# Patient Record
Sex: Female | Born: 1985
Health system: Southern US, Community
[De-identification: ages and names within clinical notes are randomized; demographics above are authoritative.]

## PROBLEM LIST (undated history)

## (undated) DIAGNOSIS — U071 COVID-19: Secondary | ICD-10-CM

## (undated) DIAGNOSIS — J45909 Unspecified asthma, uncomplicated: Secondary | ICD-10-CM

## (undated) DIAGNOSIS — D259 Leiomyoma of uterus, unspecified: Secondary | ICD-10-CM

## (undated) HISTORY — DX: Unspecified asthma, uncomplicated: J45.909

## (undated) HISTORY — PX: NO PAST SURGERIES: SHX2092

---

## 2019-07-10 ENCOUNTER — Emergency Department (HOSPITAL_COMMUNITY)
Admission: EM | Admit: 2019-07-10 | Discharge: 2019-07-10 | Disposition: A | Discharge status: Home / Self Care | Payer: 59 | Attending: Emergency Medicine | Admitting: Emergency Medicine

## 2019-07-10 ENCOUNTER — Encounter (HOSPITAL_COMMUNITY): Payer: Self-pay | Admitting: Emergency Medicine

## 2019-07-10 ENCOUNTER — Other Ambulatory Visit: Payer: Self-pay

## 2019-07-10 ENCOUNTER — Emergency Department (HOSPITAL_COMMUNITY): Payer: 59

## 2019-07-10 DIAGNOSIS — R102 Pelvic and perineal pain: Secondary | ICD-10-CM | POA: Diagnosis not present

## 2019-07-10 DIAGNOSIS — R1031 Right lower quadrant pain: Secondary | ICD-10-CM | POA: Insufficient documentation

## 2019-07-10 DIAGNOSIS — Z79899 Other long term (current) drug therapy: Secondary | ICD-10-CM | POA: Diagnosis not present

## 2019-07-10 LAB — URINALYSIS, ROUTINE W REFLEX MICROSCOPIC
Bilirubin Urine: NEGATIVE
Glucose, UA: NEGATIVE mg/dL
Hgb urine dipstick: NEGATIVE
Ketones, ur: 5 mg/dL — AB
Leukocytes,Ua: NEGATIVE
Nitrite: NEGATIVE
Protein, ur: NEGATIVE mg/dL
Specific Gravity, Urine: 1.006 (ref 1.005–1.030)
pH: 5 (ref 5.0–8.0)

## 2019-07-10 LAB — COMPREHENSIVE METABOLIC PANEL
ALT: 23 U/L (ref 0–44)
AST: 23 U/L (ref 15–41)
Albumin: 4.4 g/dL (ref 3.5–5.0)
Alkaline Phosphatase: 60 U/L (ref 38–126)
Anion gap: 13 (ref 5–15)
BUN: 10 mg/dL (ref 6–20)
CO2: 23 mmol/L (ref 22–32)
Calcium: 9.7 mg/dL (ref 8.9–10.3)
Chloride: 101 mmol/L (ref 98–111)
Creatinine, Ser: 0.69 mg/dL (ref 0.44–1.00)
GFR calc Af Amer: >60 mL/min (ref >60)
GFR calc non Af Amer: >60 mL/min (ref >60)
Glucose, Bld: 99 mg/dL (ref 70–99)
Potassium: 3.9 mmol/L (ref 3.5–5.1)
Sodium: 137 mmol/L (ref 135–145)
Total Bilirubin: 1.1 mg/dL (ref 0.3–1.2)
Total Protein: 8.4 g/dL — ABNORMAL HIGH (ref 6.5–8.1)

## 2019-07-10 LAB — CBC
HCT: 46.5 % — ABNORMAL HIGH (ref 36.0–46.0)
Hemoglobin: 15.4 g/dL — ABNORMAL HIGH (ref 12.0–15.0)
MCH: 31.2 pg (ref 26.0–34.0)
MCHC: 33.1 g/dL (ref 30.0–36.0)
MCV: 94.3 fL (ref 80.0–100.0)
Platelets: 285 10*3/uL (ref 150–400)
RBC: 4.93 MIL/uL (ref 3.87–5.11)
RDW: 13 % (ref 11.5–15.5)
WBC: 8.7 10*3/uL (ref 4.0–10.5)
nRBC: 0 % (ref 0.0–0.2)

## 2019-07-10 LAB — LIPASE, BLOOD: Lipase: 29 U/L (ref 11–51)

## 2019-07-10 LAB — I-STAT BETA HCG BLOOD, ED (MC, WL, AP ONLY): I-stat hCG, quantitative: <5 m[IU]/mL (ref <5)

## 2019-07-10 MED ORDER — IOHEXOL 300 MG/ML  SOLN
100.0000 mL | Freq: Once | INTRAMUSCULAR | Status: AC | PRN
  Administered 2019-07-10: 100 mL via INTRAVENOUS

## 2019-07-10 NOTE — ED Provider Notes (Signed)
[facility] EMERGENCY DEPARTMENT Provider Note   CSN: [id number] Arrival date & time: 07/10/19  1010     History   Chief Complaint Chief Complaint  Patient presents with  . Abdominal Pain    HPI Cassandrea Bagnasco is a 33 y.o. female.     HPI    33 year old female presents today with complaints of abdominal pain.  She notes yesterday upon awakening she had mid lower abdominal pain.  She notes that occasionally she wakes up in the morning and has pain in her lower abdomen, she is able to have a bowel movement which eases her symptoms.  She notes having a BM yesterday did not have significant improvement in her symptoms.  She notes the pain worsened throughout the day, worse with palpation of the lower abdomen diffusely.  She denies any abrupt onset pain, denies any urinary symptoms, vaginal discharge or bleeding.  She notes that today the symptoms have improved but still has some tenderness in the lower abdomen.  She was seen in urgent care and referred to the emergency room.  She notes she had a urinalysis at the urgent care that did not show any signs of infection.  She is sexually active with a female partner.  She notes she has not had anything to eat today but has been drinking water.    History reviewed. No pertinent past medical history.  There are no active problems to display for this patient.   History reviewed. No pertinent surgical history.   OB History   No obstetric history on file.      Home Medications    Prior to Admission medications   Medication Sig Start Date End Date Taking? Authorizing Provider  Prenatal Vit-Fe Fumarate-FA (MULTIVITAMIN-PRENATAL) 27-0.8 MG TABS tablet Take 1 tablet by mouth daily at 12 noon.   Yes [provider]    Family History No family history on file.  Social History Social History   Tobacco Use  . Smoking status: Never Smoker  . Smokeless tobacco: Never Used  Substance Use Topics  . Alcohol use:  Yes  . Drug use: Not Currently     Allergies   Patient has no known allergies.   Review of Systems Review of Systems  All other systems reviewed and are negative.   Physical Exam Updated Vital Signs BP 118/78 (BP Location: Right Arm)   Pulse 67   Temp 98.4 F (36.9 C) (Oral)   Resp 14   Ht 5' 5.5" (1.664 m)   Wt 77.1 kg   LMP 06/18/2019   SpO2 100%   BMI 27.86 kg/m   Physical Exam Vitals signs and nursing note reviewed.  Constitutional:      Appearance: She is well-developed.  HENT:     Head: Normocephalic and atraumatic.  Eyes:     General: No scleral icterus.       Right eye: No discharge.        Left eye: No discharge.     Conjunctiva/sclera: Conjunctivae normal.     Pupils: Pupils are equal, round, and reactive to light.  Neck:     Musculoskeletal: Normal range of motion.     Vascular: No JVD.     Trachea: No tracheal deviation.  Pulmonary:     Effort: Pulmonary effort is normal.     Breath sounds: No stridor.  Abdominal:     Comments: Abdomen is soft with minimal tenderness in the suprapubic and right lower, no rebound guarding or masses  Neurological:     Mental Status: She is alert and oriented to person, place, and time.     Coordination: Coordination normal.  Psychiatric:        Behavior: Behavior normal.        Thought Content: Thought content normal.        Judgment: Judgment normal.     ED Treatments / Results  Labs (all labs ordered are listed, but only abnormal results are displayed) Labs Reviewed  COMPREHENSIVE METABOLIC PANEL - Abnormal; Notable for the following components:      Result Value   Total Protein 8.4 (*)    All other components within normal limits  CBC - Abnormal; Notable for the following components:   Hemoglobin 15.4 (*)    HCT 46.5 (*)    All other components within normal limits  URINALYSIS, ROUTINE W REFLEX MICROSCOPIC - Abnormal; Notable for the following components:   Color, Urine STRAW (*)    Ketones, ur 5  (*)    All other components within normal limits  LIPASE, BLOOD  I-STAT BETA HCG BLOOD, ED ([facility], [facility], [facility] ONLY)    EKG None  Radiology Ct Abdomen Pelvis W Contrast  Result Date: 07/10/2019 CLINICAL DATA:  Abdominal pain. Suspected appendicitis. EXAM: CT ABDOMEN AND PELVIS WITH CONTRAST TECHNIQUE: Multidetector CT imaging of the abdomen and pelvis was performed using the standard protocol following bolus administration of intravenous contrast. CONTRAST:  149mL OMNIPAQUE IOHEXOL 300 MG/ML  SOLN COMPARISON:  None. FINDINGS: Lower Chest: No acute findings. Hepatobiliary: No hepatic masses identified. Gallbladder is unremarkable. No evidence of biliary ductal dilatation. Pancreas:  No mass or inflammatory changes. Spleen: Within normal limits in size and appearance. Adrenals/Urinary Tract: No masses identified. No evidence of hydronephrosis. Stomach/Bowel: No evidence of obstruction, inflammatory process or abnormal fluid collections. Normal appendix visualized. Vascular/Lymphatic: No pathologically enlarged lymph nodes. No abdominal aortic aneurysm. Reproductive: Uterine fibroids are seen with largest projecting exophytically from the uterine fundus into the lower abdomen, measuring 15.6 x 9.1 cm. This largest fibroid shows central calcification. A small right ovarian corpus luteum cyst is also seen measuring approximately 2 cm, and a small amount of free fluid is seen in the right adnexa. Other:  None. Musculoskeletal:  No suspicious bone lesions identified. IMPRESSION: No evidence of appendicitis. Small right ovarian corpus luteum cyst and small amount of free fluid, most likely physiologic in a reproductive age female. Uterine fibroids, largest measuring 15.6 cm. Electronically Signed   By: Marlaine Hind M.D.   On: 07/10/2019 18:02    Procedures Procedures (including critical care time)  Medications Ordered in ED Medications  iohexol (OMNIPAQUE) 300 MG/ML solution 100 mL (100 mLs Intravenous Contrast  Given 07/10/19 1737)     Initial Impression / Assessment and Plan / ED Course  I have reviewed the triage vital signs and the nursing notes.  Pertinent labs & imaging results that were available during my care of the patient were reviewed by me and considered in my medical decision making (see chart for details).        L  Assessment/Plan: 33 year old female presents today with lower abdominal pain.  She is very well-appearing in no acute distress.  She does have improvement in her symptoms upon arrival to the emergency room.  Her labs are reassuring showing no signs of infectious etiology, no fever or tachycardia here.  I discussed given the fact she was having right lower abdominal pain appendicitis.  Although I have very low suspicion for this patient  is very concerned about this and would like a CT scan rule out appendicitis.  I have low suspicion for any acute pelvic etiology including infectious torsion or any other concerning etiology at this time.  Patient CT returned showing no acute life-threatening pathology.  Incidental findings were noted including ovarian cyst and uterine fibroids these were discussed with the patient patient will follow-up with OB for discussion of implications and attempting to get pregnant.  She is given strict return precautions, she verbalized understanding and agreement to today's plan.   Final Clinical Impressions(s) / ED Diagnoses   Final diagnoses:  Right lower quadrant abdominal pain    ED Discharge Orders    None       Francee Gentile 07/11/19 1240    Lacretia Leigh, MD 07/12/19 1327

## 2019-07-10 NOTE — Discharge Instructions (Addendum)
Please read attached information. If you experience any new or worsening signs or symptoms please return to the emergency room for evaluation. Please follow-up with your primary care provider or specialist as discussed.  °

## 2019-07-10 NOTE — ED Notes (Signed)
Patient verbalizes understanding of discharge instructions. Opportunity for questioning and answers were provided. Armband removed by staff, pt discharged from ED ambulatory.   

## 2019-07-10 NOTE — ED Triage Notes (Signed)
Patient sent from UC c/o abdominal pain onset of yesterday. Pain is lower right quadrant and mid abdominal and sharp. Normal BMs. Urinalysis completed at [facility].

## 2019-09-07 NOTE — L&D Delivery Note (Signed)
Delivery Note At 6:03 AM a viable and healthy female was delivered via Vaginal, Spontaneous (Presentation: right occiput, anterior      ).  APGAR: 8, 9; weight pending .   Placenta status:  spontaneous, intact .  Cord: 3VC    Anesthesia: Epidural Episiotomy:  None Lacerations:  None Suture Repair: NA Est. Blood Loss (mL):  2228cc  Mom to postpartum.  Baby to Couplet care / Skin to Skin.  For approximately 60 minutes with an epidural.  She delivered a vigorous delivery vertex ROA presentation with Apgar scores of 8 at 1 minute and 9 at 5 minutes for an intact perineum.  The infant was passed to the maternal abdomen.  1 minute delay in cord clamping was performed.  Placenta spontaneously delivered intact.  Following delivery of the placenta brisk bleeding was noted.  Bimanual massage was initiated.  Methergine was called for.  Additionally misoprostol 1000 mcg was requested.  Despite vigorous bimanual massage, rectal misoprostol 1000 mcg, and IM Methergine bleeding continued.  Tranexamic acid 1 g requested.  Vigorous bimanual massage was continued.  Hemabate was requested.  Code hemorrhage was called at an EBL of 1200 cc.  Persistent at the lower uterine segment was noted.  Bakri Balloon placement was attempted x2.  Each time the balloon could be filled to a max volume of 150 cc but then was expelled through the cervix into the vagina.  Following removal after the second attempt the patient's bleeding was noted to be minimal.  No further attempts at balloon placement were performed.  A Foley catheter was placed in the bladder.  Code hemorrhage resolved.  Total EBL 2228 cc.  Patient starting hemoglobin was 11.5.  Estimated ending hemoglobin base EBL approximately 7-8.  Patient's vitals were stable.  Patient was normotensive with mild tachycardia 107.  Discussed with patient risks/benefits/alternatives of proceeding with blood transfusion versus waiting.  Patient was feeling dizzy.  Given EBL we will proceed  with transfusion, 1 unit packed red cells.  Will reassess patient symptoms following transfusion of 1 unit.  All sponge, instrument, needle counts were correct.  Mother spiked a temp to 101 following code hemorrhage.  We will proceed with Ancef 1 g every 8 hours for 24 hours   Vanessa Kick 05/30/2020, 6:46 AM

## 2019-09-17 ENCOUNTER — Emergency Department (HOSPITAL_COMMUNITY): Payer: 59

## 2019-09-17 ENCOUNTER — Other Ambulatory Visit: Payer: Self-pay

## 2019-09-17 ENCOUNTER — Emergency Department (HOSPITAL_COMMUNITY)
Admission: EM | Admit: 2019-09-17 | Discharge: 2019-09-17 | Disposition: A | Discharge status: Home / Self Care | Payer: 59 | Attending: Emergency Medicine | Admitting: Emergency Medicine

## 2019-09-17 ENCOUNTER — Encounter (HOSPITAL_COMMUNITY): Payer: Self-pay | Admitting: Family Medicine

## 2019-09-17 DIAGNOSIS — Z79899 Other long term (current) drug therapy: Secondary | ICD-10-CM | POA: Diagnosis not present

## 2019-09-17 DIAGNOSIS — U071 COVID-19: Secondary | ICD-10-CM | POA: Diagnosis not present

## 2019-09-17 DIAGNOSIS — M791 Myalgia, unspecified site: Secondary | ICD-10-CM | POA: Diagnosis present

## 2019-09-17 HISTORY — DX: COVID-19: U07.1

## 2019-09-17 NOTE — ED Triage Notes (Signed)
Patient reports she had a COVID + test at [facility] on 1/04. Then, on 01/10, another PCR COVID test resulted +. Patient is complaining of coughing and having difficulty with coughing.

## 2019-09-17 NOTE — ED Provider Notes (Signed)
[facility] DEPT Provider Note   CSN: [id number] Arrival date & time: 09/17/19  1335     History Chief Complaint  Patient presents with  . Covid Vaccine   . Cough    Linda Guerra is a 34 y.o. female.  Patient indicates 8 days ago developed symptoms c/w covid, and subsequently has had 2 tests positive for covid. States symptoms acute onset, moderate, persistent, slowly worse. C/o body aches, fever, non productive cough. States in past 1-2 days cough seems increased, is non productive, w mild sob. No chest pain or discomfort. No abd pain or nvd. No leg pain or swelling.   The history is provided by the patient.  Cough Associated symptoms: fever, myalgias and shortness of breath   Associated symptoms: no chest pain, no headaches, no rash and no sore throat        Past Medical History:  Diagnosis Date  . COVID-19     There are no problems to display for this patient.   History reviewed. No pertinent surgical history.   OB History   No obstetric history on file.     History reviewed. No pertinent family history.  Social History   Tobacco Use  . Smoking status: Never Smoker  . Smokeless tobacco: Never Used  Substance Use Topics  . Alcohol use: Not Currently  . Drug use: Not Currently    Home Medications Prior to Admission medications   Medication Sig Start Date End Date Taking? Authorizing Provider  Prenatal Vit-Fe Fumarate-FA (MULTIVITAMIN-PRENATAL) 27-0.8 MG TABS tablet Take 1 tablet by mouth daily at 12 noon.    [provider]    Allergies    Patient has no known allergies.  Review of Systems   Review of Systems  Constitutional: Positive for fever.  HENT: Negative for sore throat.   Eyes: Negative for redness.  Respiratory: Positive for cough and shortness of breath.   Cardiovascular: Negative for chest pain.  Gastrointestinal: Negative for abdominal pain, diarrhea and vomiting.  Genitourinary: Negative for  flank pain.  Musculoskeletal: Positive for myalgias. Negative for neck pain and neck stiffness.  Skin: Negative for rash.  Neurological: Negative for headaches.  Hematological: Does not bruise/bleed easily.  Psychiatric/Behavioral: Negative for confusion.    Physical Exam Updated Vital Signs BP 115/87 (BP Location: Left Arm)   Pulse 80   Temp 99.2 F (37.3 C) (Oral)   Resp 18   Ht 1.651 m (5\' 5" )   Wt 79.9 kg   LMP 09/09/2019   SpO2 94%   BMI 29.32 kg/m   Physical Exam Vitals and nursing note reviewed.  Constitutional:      Appearance: Normal appearance. She is well-developed.  HENT:     Head: Atraumatic.     Nose: Nose normal.     Mouth/Throat:     Mouth: Mucous membranes are moist.     Pharynx: Oropharynx is clear.  Eyes:     General: No scleral icterus.    Conjunctiva/sclera: Conjunctivae normal.  Neck:     Trachea: No tracheal deviation.     Comments: No stiffness or rigidity Cardiovascular:     Rate and Rhythm: Normal rate and regular rhythm.     Pulses: Normal pulses.     Heart sounds: Normal heart sounds. No murmur. No friction rub. No gallop.   Pulmonary:     Effort: Pulmonary effort is normal. No respiratory distress.     Breath sounds: Normal breath sounds.  Abdominal:     General:  Bowel sounds are normal. There is no distension.     Palpations: Abdomen is soft.     Tenderness: There is no abdominal tenderness. There is no guarding.  Genitourinary:    Comments: No cva tenderness.  Musculoskeletal:        General: No swelling or tenderness.     Cervical back: Normal range of motion and neck supple. No rigidity. No muscular tenderness.     Right lower leg: No edema.     Left lower leg: No edema.  Skin:    General: Skin is warm and dry.     Findings: No rash.  Neurological:     Mental Status: She is alert.     Comments: Alert, speech normal. Steady gait.   Psychiatric:        Mood and Affect: Mood normal.     ED Results / Procedures /  Treatments   Labs (all labs ordered are listed, but only abnormal results are displayed) Labs Reviewed - No data to display  EKG None  Radiology XR Chest Portable  Result Date: 09/17/2019 CLINICAL DATA:  COVID positive.  Cough EXAM: PORTABLE CHEST 1 VIEW COMPARISON:  None FINDINGS: Heart and mediastinal contours are within normal limits. No focal opacities or effusions. No acute bony abnormality. IMPRESSION: Negative. Electronically Signed   By: Rolm Baptise M.D.   On: 09/17/2019 18:12    Procedures Procedures (including critical care time)  Medications Ordered in ED Medications - No data to display  ED Course  I have reviewed the triage vital signs and the nursing notes.  Pertinent labs & imaging results that were available during my care of the patient were reviewed by me and considered in my medical decision making (see chart for details).    MDM Rules/Calculators/A&P                      CXR.  Reviewed nursing notes and prior charts for additional history.   CXR reviewed/interpreted by me - no pna.  Patient is breathing comfortably, room air pulse ox is currently 98%.   Patient appears stable for d/c.   Return precautions provided.        Final Clinical Impression(s) / ED Diagnoses Final diagnoses:  None    Rx / DC Orders ED Discharge Orders    None       Lajean Saver, MD 09/17/19 [phone]

## 2019-09-17 NOTE — Discharge Instructions (Addendum)
It was our pleasure to provide your ER care today - we hope that you feel better.  Your chest xray looks good/normal.  See COVID instructions/information.   You may take acetaminophen or ibuprofen as need for fever/aches. You may try taking mucinex, robitussin, or nyquil as need to help with cough.  Follow up with primary care doctor in 1-2 weeks if symptoms fail to improve/resolve.  Return to ER if worse, increased trouble breathing, or other concern.

## 2019-10-13 ENCOUNTER — Other Ambulatory Visit: Payer: Self-pay

## 2019-10-13 ENCOUNTER — Encounter (HOSPITAL_COMMUNITY): Payer: Self-pay

## 2019-10-13 ENCOUNTER — Emergency Department (HOSPITAL_COMMUNITY): Payer: 59

## 2019-10-13 ENCOUNTER — Emergency Department (HOSPITAL_COMMUNITY)
Admission: EM | Admit: 2019-10-13 | Discharge: 2019-10-13 | Disposition: A | Discharge status: Home / Self Care | Payer: 59 | Attending: Emergency Medicine | Admitting: Emergency Medicine

## 2019-10-13 DIAGNOSIS — J069 Acute upper respiratory infection, unspecified: Secondary | ICD-10-CM | POA: Diagnosis not present

## 2019-10-13 DIAGNOSIS — Z79899 Other long term (current) drug therapy: Secondary | ICD-10-CM | POA: Diagnosis not present

## 2019-10-13 DIAGNOSIS — R05 Cough: Secondary | ICD-10-CM | POA: Diagnosis present

## 2019-10-13 DIAGNOSIS — Z8616 Personal history of COVID-19: Secondary | ICD-10-CM | POA: Diagnosis not present

## 2019-10-13 NOTE — Discharge Instructions (Signed)
You may continue taking the medications that OB/GYN and I discussed here in the ED.  Return for any worsening symptoms.  Keep your OB/GYN follow-up appointment.

## 2019-10-13 NOTE — ED Triage Notes (Signed)
Patient reports she tested positive for COVID on 09/10/19 and then negative on 10/04/19. Patient states she has residual cough and wants to make sure her lungs and chest are clear and is requesting a CXR. Patient states she is [redacted] weeks pregnant and limited on the types of medications she is allowed to take

## 2019-10-13 NOTE — ED Provider Notes (Signed)
[facility] DEPT Provider Note   CSN: [id number] Arrival date & time: 10/13/19  [id number]     History Chief Complaint  Patient presents with  . Cough   Linda Guerra is a 34 y.o. female with past medical history sniffing for current pregnancy, G3 P0 at approximately 5 weeks by dates who presents for evaluation of cough.  Patient diagnosed with Covid on 09/10/2019.  She subsequently had additional test on 10/04/2019.  Her last test was negative.  Patient states she continues to have cough consisting of clear sputum.  Patient requesting chest x-ray.  No chest pain, shortness of breath, hemoptysis, lateral leg swelling, redness or warmth. No hx of PE, DVT. Cough has significantly improved however patient wanted to "just make sure."   No abd pain, vaginal bleeding, vaginal discharge. Is taking prenatal vitamins.  Patient she was told she can take Mucinex as well as Robitussin however patient is concerned about taking this.  ObGYn-- Dr. Carlis Abbott, [facility]  History obtained from patient and past medical records. No interpretor was used.  HPI     Past Medical History:  Diagnosis Date  . COVID-19     There are no problems to display for this patient.   No past surgical history on file.   OB History    Gravida  1   Para      Term      Preterm      AB      Living        SAB      TAB      Ectopic      Multiple      Live Births              No family history on file.  Social History   Tobacco Use  . Smoking status: Never Smoker  . Smokeless tobacco: Never Used  Substance Use Topics  . Alcohol use: Not Currently  . Drug use: Not Currently    Home Medications Prior to Admission medications   Medication Sig Start Date End Date Taking? Authorizing Provider  calcium carbonate (TUMS - DOSED IN MG ELEMENTAL CALCIUM) 500 MG chewable tablet Chew 1 tablet by mouth 2 (two) times daily as needed for indigestion or heartburn.   Yes  [provider]  guaiFENesin (ROBITUSSIN) 100 MG/5ML liquid Take 200 mg by mouth 3 (three) times daily as needed for cough.   Yes [provider]  Prenatal Vit-Fe Fumarate-FA (MULTIVITAMIN-PRENATAL) 27-0.8 MG TABS tablet Take 1 tablet by mouth daily.    Yes [provider]    Allergies    Patient has no known allergies.  Review of Systems   Review of Systems  Constitutional: Negative.   HENT: Negative.   Respiratory: Positive for cough. Negative for apnea, choking, chest tightness, shortness of breath, wheezing and stridor.   Cardiovascular: Negative.   Gastrointestinal: Negative.   Genitourinary: Negative.   Musculoskeletal: Negative.   Skin: Negative.   Neurological: Negative.   All other systems reviewed and are negative.   Physical Exam Updated Vital Signs BP (!) 129/93 (BP Location: Left Arm)   Pulse 96   Temp 98 F (36.7 C) (Oral)   Resp 18   Ht 5\' 5"  (1.651 m)   Wt 79.9 kg   LMP 09/09/2019   SpO2 97%   BMI 29.32 kg/m   Physical Exam Vitals and nursing note reviewed.  Constitutional:      General: She is not  in acute distress.    Appearance: She is not ill-appearing, toxic-appearing or diaphoretic.  HENT:     Head: Normocephalic and atraumatic.     Jaw: There is normal jaw occlusion.     Nose:     Comments: Clear rhinorrhea and congestion to bilateral nares.  No sinus tenderness.    Mouth/Throat:     Comments: Posterior oropharynx clear.  Mucous membranes moist.  Tonsils without erythema or exudate.  Uvula midline without deviation.  No evidence of PTA or RPA.  No drooling, dysphasia or trismus.  Phonation normal. Neck:     Trachea: Trachea and phonation normal.     Comments:  No cervical lymphadenopathy. Cardiovascular:     Rate and Rhythm: Normal rate.     Pulses: Normal pulses.     Heart sounds: Normal heart sounds.     Comments: No murmurs rubs or gallops. Pulmonary:     Effort: Pulmonary effort is normal.     Breath  sounds: Normal breath sounds and air entry.     Comments: Clear to auscultation bilaterally without wheeze, rhonchi or rales.  No accessory muscle usage.  Able speak in full sentences. Abdominal:     General: Bowel sounds are normal.     Tenderness: There is no abdominal tenderness.     Comments: Soft, nontender without rebound or guarding.  No CVA tenderness.  Musculoskeletal:     Cervical back: Full passive range of motion without pain and normal range of motion.     Comments: Moves all 4 extremities without difficulty.  Lower extremities without edema, erythema or warmth.  Skin:    Comments: Brisk capillary refill.  No rashes or lesions.  Neurological:     Mental Status: She is alert.     Comments: Ambulatory in department without difficulty.  Cranial nerves II through XII grossly intact.       ED Results / Procedures / Treatments   Labs (all labs ordered are listed, but only abnormal results are displayed) Labs Reviewed - No data to display  EKG None  Radiology DG Chest Portable 1 View  Result Date: 10/13/2019 CLINICAL DATA:  Cough.  COVID positive. EXAM: PORTABLE CHEST 1 VIEW COMPARISON:  09/17/2019 FINDINGS: The cardiomediastinal silhouette is unremarkable. There is no evidence of focal airspace disease, pulmonary edema, suspicious pulmonary nodule/mass, pleural effusion, or pneumothorax. No acute bony abnormalities are identified. IMPRESSION: No active disease. Electronically Signed   By: Margarette Canada M.D.   On: 10/13/2019 10:05    Procedures Procedures (including critical care time)  Medications Ordered in ED Medications - No data to display  ED Course  I have reviewed the triage vital signs and the nursing notes.  Pertinent labs & imaging results that were available during my care of the patient were reviewed by me and considered in my medical decision making (see chart for details).  34 year old female presents for evaluation of cough.  Known Covid +16-month ago.  Has  subsequently had test outpatient.  She has persistent cough with clear sputum.  She is proximately 5 weeks 0 days pregnant.  G3, P0.  [facility]ed [facility], Dr. Carlis Abbott.  No abdominal pain, vaginal bleeding, cramping, vaginal discharge.  She tolerating p.o. intake at home without difficulty.  History of PE or DVT.  No hemoptysis, shortness of breath or chest pain.  Her heart and lungs are clear.  No evidence of DVT on exam.  Patient requesting chest x-ray.  Discussed risk versus benefit.  Will obtain  chest x-ray per patient request.  Discussed with patient she may have residual symptoms from Covid times months.  Discussed symptoms which would require emergent evaluation. Cough has significantly improved however patient wanted to "just make sure."  Plan from chest x-ray without acute findings.  No hypoxia, tachycardia or tachypnea.  Patient looks overall well.  We will have her keep her follow-up appointment with her OB/GYN.  Discussed strict return precautions.  Likely sequela from COVID viral illness. Low suspicion for atypical PE, myocarditis, pneumothorax, bacterial infectious process.  The patient has been appropriately medically screened and/or stabilized in the ED. I have low suspicion for any other emergent medical condition which would require further screening, evaluation or treatment in the ED or require inpatient management.  Patient is hemodynamically stable and in no acute distress.  Patient able to ambulate in department prior to ED.  Evaluation does not show acute pathology that would require ongoing or additional emergent interventions while in the emergency department or further inpatient treatment.  I have discussed the diagnosis with the patient and answered all questions.  Pain is been managed while in the emergency department and patient has no further complaints prior to discharge.  Patient is comfortable with plan discussed in room and is stable for discharge at this time.  I  have discussed strict return precautions for returning to the emergency department.  Patient was encouraged to follow-up with PCP/specialist refer to at discharge.    MDM Rules/Calculators/A&P                      Yuvette Postal was evaluated in Emergency Department on 10/13/2019 for the symptoms described in the history of present illness. She was evaluated in the context of the global COVID-19 pandemic, which necessitated consideration that the patient might be at risk for infection with the SARS-CoV-2 virus that causes COVID-19. Institutional protocols and algorithms that pertain to the evaluation of patients at risk for COVID-19 are in a state of rapid change based on information released by regulatory bodies including the CDC and federal and state organizations. These policies and algorithms were followed during the patient's care in the ED. Final Clinical Impression(s) / ED Diagnoses Final diagnoses:  Viral URI with cough    Rx / DC Orders ED Discharge Orders    None       Frankie Scipio A, PA-C 10/13/19 1017    Valarie Merino, MD 10/14/19 [phone]

## 2019-11-21 ENCOUNTER — Encounter: Payer: Self-pay | Admitting: Family Medicine

## 2019-11-21 ENCOUNTER — Telehealth (INDEPENDENT_AMBULATORY_CARE_PROVIDER_SITE_OTHER): Payer: 59 | Admitting: Family Medicine

## 2019-11-21 VITALS — Temp 98.0°F | Ht 65.0 in | Wt 167.0 lb

## 2019-11-21 DIAGNOSIS — R053 Chronic cough: Secondary | ICD-10-CM | POA: Insufficient documentation

## 2019-11-21 DIAGNOSIS — U099 Post covid-19 condition, unspecified: Secondary | ICD-10-CM | POA: Insufficient documentation

## 2019-11-21 DIAGNOSIS — R05 Cough: Secondary | ICD-10-CM | POA: Diagnosis not present

## 2019-11-21 DIAGNOSIS — R059 Cough, unspecified: Secondary | ICD-10-CM | POA: Insufficient documentation

## 2019-11-21 DIAGNOSIS — R058 Other specified cough: Secondary | ICD-10-CM

## 2019-11-21 NOTE — Progress Notes (Signed)
New Patient Office Visit  Subjective:  Patient ID: Linda Guerra, female    DOB: 10/27/1985  Age: 34 y.o. MRN: [id number]  CC:  Chief Complaint  Patient presents with  . Cough    c/o dry cough x 1 month patient was positive for covid in January have had negative results since February, cough will not go away patient pregnant and have stop taking cough syrup.    HPI [name] presents for evaluation of a dry cough since January. This seemed to start after she had been diagnosed with Covid this past January prior to onset. Denies ongoing fever, phlegm or upper respiratory symptoms. Has tried robitussin and mucinex. Currently 10 weeks and 3 days pregnant. No asthma history. Denies wheezing or tightness in the chest.   Past Medical History:  Diagnosis Date  . COVID-19     History reviewed. No pertinent surgical history.  History reviewed. No pertinent family history.  Social History   Socioeconomic History  . Marital status: Married    Spouse name: Not on file  . Number of children: Not on file  . Years of education: Not on file  . Highest education level: Not on file  Occupational History  . Not on file  Tobacco Use  . Smoking status: Never Smoker  . Smokeless tobacco: Never Used  Substance and Sexual Activity  . Alcohol use: Not Currently  . Drug use: Not Currently  . Sexual activity: Yes  Other Topics Concern  . Not on file  Social History Narrative  . Not on file   Social Determinants of Health   Financial Resource Strain:   . Difficulty of Paying Living Expenses:   Food Insecurity:   . Worried About Charity fundraiser in the Last Year:   . Arboriculturist in the Last Year:   Transportation Needs:   . Film/video editor (Medical):   Marland Kitchen Lack of Transportation (Non-Medical):   Physical Activity:   . Days of Exercise per Week:   . Minutes of Exercise per Session:   Stress:   . Feeling of Stress :   Social Connections:   . Frequency of Communication  with Friends and Family:   . Frequency of Social Gatherings with Friends and Family:   . Attends Religious Services:   . Active Member of Clubs or Organizations:   . Attends Archivist Meetings:   Marland Kitchen Marital Status:   Intimate Partner Violence:   . Fear of Current or Ex-Partner:   . Emotionally Abused:   Marland Kitchen Physically Abused:   . Sexually Abused:     ROS Review of Systems  Objective:   Today's Vitals: Temp 98 F (36.7 C) (Tympanic) Comment: per pt  Ht 5\' 5"  (1.651 m)   Wt 167 lb (75.8 kg) Comment: per pt  LMP 09/09/2019   BMI 27.79 kg/m   Physical Exam  Assessment & Plan:   Problem List Items Addressed This Visit      Respiratory   Post-viral cough syndrome - Primary      Outpatient Encounter Medications as of 11/21/2019  Medication Sig  . Prenatal Vit-Fe Fumarate-FA (MULTIVITAMIN-PRENATAL) 27-0.8 MG TABS tablet Take 1 tablet by mouth daily.   . calcium carbonate (TUMS - DOSED IN MG ELEMENTAL CALCIUM) 500 MG chewable tablet Chew 1 tablet by mouth 2 (two) times daily as needed for indigestion or heartburn.  Marland Kitchen guaiFENesin (ROBITUSSIN) 100 MG/5ML liquid Take 200 mg by mouth 3 (three) times daily as needed  for cough.   No facility-administered encounter medications on file as of 11/21/2019.    Follow-up: No follow-ups on file.  Believe that the patient would benefit from a brief course of prednisone.I sent a text to patient's doctor, Jerelyn Charles for input. Patient says that she can wait for a response. Patient has an appointment with her Gyn next Wednesday. Libby Maw, MD   Virtual Visit via Video Note  I connected with Jeani Hawking on 11/21/19 at 10:30 AM EDT by a video enabled telemedicine application and verified that I am speaking with the correct person using two identifiers.  Location: Patient: at work alone in her office.  Provider:    I discussed the limitations of evaluation and management by telemedicine and the availability of in  person appointments. The patient expressed understanding and agreed to proceed.  History of Present Illness:    Observations/Objective:   Assessment and Plan:   Follow Up Instructions:    I discussed the assessment and treatment plan with the patient. The patient was provided an opportunity to ask questions and all were answered. The patient agreed with the plan and demonstrated an understanding of the instructions.   The patient was advised to call back or seek an in-person evaluation if the symptoms worsen or if the condition fails to improve as anticipated.  I provided 25 minutes of non-face-to-face time during this encounter.   Libby Maw, MD

## 2019-11-23 ENCOUNTER — Ambulatory Visit: Payer: 59

## 2019-11-28 LAB — OB RESULTS CONSOLE GC/CHLAMYDIA
Chlamydia: NEGATIVE
Gonorrhea: NEGATIVE

## 2019-11-28 LAB — OB RESULTS CONSOLE HEPATITIS B SURFACE ANTIGEN: Hepatitis B Surface Ag: NEGATIVE

## 2019-11-28 LAB — OB RESULTS CONSOLE HIV ANTIBODY (ROUTINE TESTING): HIV: NONREACTIVE

## 2019-11-28 LAB — OB RESULTS CONSOLE RPR: RPR: NONREACTIVE

## 2019-11-28 LAB — OB RESULTS CONSOLE RUBELLA ANTIBODY, IGM: Rubella: IMMUNE

## 2019-12-03 ENCOUNTER — Emergency Department (HOSPITAL_COMMUNITY)
Admission: EM | Admit: 2019-12-03 | Discharge: 2019-12-03 | Disposition: A | Discharge status: Home / Self Care | Payer: 59 | Attending: Emergency Medicine | Admitting: Emergency Medicine

## 2019-12-03 ENCOUNTER — Other Ambulatory Visit: Payer: Self-pay

## 2019-12-03 ENCOUNTER — Encounter (HOSPITAL_COMMUNITY): Payer: Self-pay

## 2019-12-03 DIAGNOSIS — Z3A12 12 weeks gestation of pregnancy: Secondary | ICD-10-CM | POA: Insufficient documentation

## 2019-12-03 DIAGNOSIS — O26892 Other specified pregnancy related conditions, second trimester: Secondary | ICD-10-CM | POA: Insufficient documentation

## 2019-12-03 DIAGNOSIS — K219 Gastro-esophageal reflux disease without esophagitis: Secondary | ICD-10-CM | POA: Insufficient documentation

## 2019-12-03 HISTORY — DX: Leiomyoma of uterus, unspecified: D25.9

## 2019-12-03 LAB — URINALYSIS, ROUTINE W REFLEX MICROSCOPIC
Bilirubin Urine: NEGATIVE
Glucose, UA: NEGATIVE mg/dL
Hgb urine dipstick: NEGATIVE
Ketones, ur: 5 mg/dL — AB
Leukocytes,Ua: NEGATIVE
Nitrite: NEGATIVE
Protein, ur: NEGATIVE mg/dL
Specific Gravity, Urine: 1.023 (ref 1.005–1.030)
pH: 6 (ref 5.0–8.0)

## 2019-12-03 LAB — CBC WITH DIFFERENTIAL/PLATELET
Abs Immature Granulocytes: 0.06 10*3/uL (ref 0.00–0.07)
Basophils Absolute: 0 10*3/uL (ref 0.0–0.1)
Basophils Relative: 0 %
Eosinophils Absolute: 0 10*3/uL (ref 0.0–0.5)
Eosinophils Relative: 0 %
HCT: 39.8 % (ref 36.0–46.0)
Hemoglobin: 13.4 g/dL (ref 12.0–15.0)
Immature Granulocytes: 1 %
Lymphocytes Relative: 6 %
Lymphs Abs: 0.7 10*3/uL (ref 0.7–4.0)
MCH: 32.1 pg (ref 26.0–34.0)
MCHC: 33.7 g/dL (ref 30.0–36.0)
MCV: 95.2 fL (ref 80.0–100.0)
Monocytes Absolute: 0.4 10*3/uL (ref 0.1–1.0)
Monocytes Relative: 3 %
Neutro Abs: 10.1 10*3/uL — ABNORMAL HIGH (ref 1.7–7.7)
Neutrophils Relative %: 90 %
Platelets: 226 10*3/uL (ref 150–400)
RBC: 4.18 MIL/uL (ref 3.87–5.11)
RDW: 13.4 % (ref 11.5–15.5)
WBC: 11.2 10*3/uL — ABNORMAL HIGH (ref 4.0–10.5)
nRBC: 0 % (ref 0.0–0.2)

## 2019-12-03 LAB — COMPREHENSIVE METABOLIC PANEL
ALT: 28 U/L (ref 0–44)
AST: 22 U/L (ref 15–41)
Albumin: 4.3 g/dL (ref 3.5–5.0)
Alkaline Phosphatase: 56 U/L (ref 38–126)
Anion gap: 9 (ref 5–15)
BUN: 13 mg/dL (ref 6–20)
CO2: 23 mmol/L (ref 22–32)
Calcium: 9.6 mg/dL (ref 8.9–10.3)
Chloride: 105 mmol/L (ref 98–111)
Creatinine, Ser: 0.47 mg/dL (ref 0.44–1.00)
GFR calc Af Amer: >60 mL/min (ref >60)
GFR calc non Af Amer: >60 mL/min (ref >60)
Glucose, Bld: 114 mg/dL — ABNORMAL HIGH (ref 70–99)
Potassium: 3.9 mmol/L (ref 3.5–5.1)
Sodium: 137 mmol/L (ref 135–145)
Total Bilirubin: 0.3 mg/dL (ref 0.3–1.2)
Total Protein: 7.8 g/dL (ref 6.5–8.1)

## 2019-12-03 LAB — LIPASE, BLOOD: Lipase: 36 U/L (ref 11–51)

## 2019-12-03 MED ORDER — SUCRALFATE 1 G PO TABS
1.0000 g | ORAL_TABLET | Freq: Once | ORAL | Status: AC
  Administered 2019-12-03: 1 g via ORAL
  Filled 2019-12-03: qty 1

## 2019-12-03 NOTE — ED Triage Notes (Signed)
Patient states she is [redacted] weeks pregnant and has been having upper abdominal pain since 0100 today. Patient states she called her OB/GYN and was told it was heartburn. Patient states she took Tums x 4 tabs and is still having the pain.

## 2019-12-03 NOTE — ED Provider Notes (Signed)
[facility] DEPT Provider Note   CSN: [id number] Arrival date & time: 12/03/19  0725     History Chief Complaint  Patient presents with  . Abdominal Pain    Linda Guerra is a 34 y.o. female.  34 y.o female G1P0 currently [redacted] weeks pregnant with no PMH presents to the ED with a chief complaint of epigastric pain x 7 hours. Patient reports having [place] food last night, reports "I feel like I did this to myself", "I have salsa ad spicy food which the baby does not like". She tried taking 4 Tums around 2 am without improvement in her symptoms.  She called her OBGYN who recommended Prilosec but reports she was told this would likely not take effect for the next 4 days.  She states feeling worried about the pain and wanted to be checked in. She recently had her first OB appointment with confirmation of an IUP measuring at 12 weeks. She is followed by Guadalupe Maple at [facility]. No vaginal discharge, no vaginal bleeding, no lower abdominal pain, no urinary symptoms.    The history is provided by the patient.  Abdominal Pain Pain location:  Epigastric Pain quality: burning   Pain radiates to:  Does not radiate Pain severity:  Severe Duration:  6 hours Timing:  Constant Associated symptoms: nausea   Associated symptoms: no fever, no shortness of breath, no sore throat and no vomiting        Past Medical History:  Diagnosis Date  . COVID-19   . Uterine fibroid     Patient Active Problem List   Diagnosis Date Noted  . Post-viral cough syndrome 11/21/2019    History reviewed. No pertinent surgical history.   OB History    Gravida  1   Para      Term      Preterm      AB      Living        SAB      TAB      Ectopic      Multiple      Live Births              Family History  Problem Relation Age of Onset  . Healthy Mother   . Healthy Father     Social History   Tobacco Use  . Smoking status: Never Smoker  . Smokeless  tobacco: Never Used  Substance Use Topics  . Alcohol use: Not Currently  . Drug use: Not Currently    Home Medications Prior to Admission medications   Medication Sig Start Date End Date Taking? Authorizing Provider  calcium carbonate (TUMS - DOSED IN MG ELEMENTAL CALCIUM) 500 MG chewable tablet Chew 1 tablet by mouth 2 (two) times daily as needed for indigestion or heartburn.   Yes [provider]  guaiFENesin (ROBITUSSIN) 100 MG/5ML liquid Take 200 mg by mouth 3 (three) times daily as needed for cough.   Yes [provider]  omeprazole (PRILOSEC) 20 MG capsule Take 20 mg by mouth daily as needed (indigestion).   Yes [provider]  Prenatal Vit-Fe Fumarate-FA (MULTIVITAMIN-PRENATAL) 27-0.8 MG TABS tablet Take 1 tablet by mouth daily.    Yes [provider]  sodium chloride (OCEAN) 0.65 % SOLN nasal spray Place 1 spray into both nostrils as needed for congestion.   Yes [provider]    Allergies    Patient has no known allergies.  Review of Systems   Review of  Systems  Constitutional: Negative for fever.  HENT: Negative for sinus pressure and sore throat.   Respiratory: Negative for shortness of breath.   Gastrointestinal: Positive for abdominal pain and nausea. Negative for vomiting.  Genitourinary: Negative for flank pain.  Musculoskeletal: Negative for back pain.  Neurological: Negative for light-headedness and headaches.  All other systems reviewed and are negative.   Physical Exam Updated Vital Signs BP 102/71   Pulse 76   Temp 98 F (36.7 C) (Oral)   Resp 18   Ht 5\' 5"  (1.651 m)   Wt 75.8 kg   LMP 09/09/2019   SpO2 100%   BMI 27.79 kg/m   Physical Exam Vitals and nursing note reviewed.  Constitutional:      Appearance: She is not ill-appearing.  HENT:     Head: Normocephalic and atraumatic.  Cardiovascular:     Rate and Rhythm: Normal rate.  Pulmonary:     Effort: Pulmonary effort is normal.     Breath  sounds: No wheezing, rhonchi or rales.     Comments: Lungs are clear to exudation no wheezing, rhonchi, rales. Abdominal:     General: Abdomen is flat. Bowel sounds are normal.     Palpations: Abdomen is soft.     Tenderness: There is no abdominal tenderness. There is no right CVA tenderness or left CVA tenderness.     Hernia: No hernia is present.     Comments: Abdomen is soft, nontender to palpation.  Bowel sounds are normal.  Skin:    General: Skin is warm and dry.  Neurological:     Mental Status: She is alert and oriented to person, place, and time.     ED Results / Procedures / Treatments   Labs (all labs ordered are listed, but only abnormal results are displayed) Labs Reviewed  CBC WITH DIFFERENTIAL/PLATELET - Abnormal; Notable for the following components:      Result Value   WBC 11.2 (*)    Neutro Abs 10.1 (*)    All other components within normal limits  COMPREHENSIVE METABOLIC PANEL - Abnormal; Notable for the following components:   Glucose, Bld 114 (*)    All other components within normal limits  URINALYSIS, ROUTINE W REFLEX MICROSCOPIC - Abnormal; Notable for the following components:   Ketones, ur 5 (*)    All other components within normal limits  LIPASE, BLOOD    EKG None  Radiology No results found.  Procedures Procedures (including critical care time)  Medications Ordered in ED Medications  sucralfate (CARAFATE) tablet 1 g (1 g Oral Given 12/03/19 0947)    ED Course  I have reviewed the triage vital signs and the nursing notes.  Pertinent labs & imaging results that were available during my care of the patient were reviewed by me and considered in my medical decision making (see chart for details).    MDM Rules/Calculators/A&P     Patient G1P0 currently [redacted] weeks pregnant presents to the ED with complaints of epigastric pain sudden onset at 4 AM.  Patient reports having [place] food last night along with spicy food which she states is not set  well with her stomach.  Reports taking Tums x4 along with Prilosec without improvement in her symptoms. She called her OB/GYN who commended Prilosec, states that she wanteD to be evaluated as she is worried about baby.  My evaluation is overall well-appearing, vitals are within normal limits, she is afebrile.  Lungs are clear to auscultation, abdomen is soft, nontender to  palpation but does report pain along the epigastric region right upper quadrant and left upper quadrant.  Also reports a dry mouth which she states is normal with her pregnancy.  Has not had any bleeding, discharge, lower abdominal pain or cramping, no urinary symptoms. Discussed obtaining basic screening labs to further rule out any gallbladder pathology may be exacerbating her symptoms.  Patient is agreeable of this.  Interpretation of her labs reveal a CBC with a slight leukocytosis at 11.2, has not been running any fevers, hemoglobin is unremarkable without any signs of anemia.  CMP without any electrolyte normality, glucose is slightly elevated, creatinine level is unremarkable.  LFTs are within normal limits.  Lipase level is unremarkable.  Lower suspicion for the gallbladder pathology with a reassuring LFTs and no vomiting.  UA have some ketones present, but no nitrites, leukocytes, or signs of infection.  Patient was given Carafate to help with her symptoms, she is also had Prilosec along with Tums at home.  Suspect that she will likely need to continue this therapy.  Does not have any lower abdominal pain,vaginal  bleeding.   She was reevaluated by me reports mild improvement after belching. Results of her lab work were discussed with her at length, recommended OB follow-up as needed.  Patient is an agrees to management, return precautions discussed at length.  11:14 AM patient has not taking Carafate as she reports she needed to check with her OB, provide her the up-to-date printout on Carafate medication.  Advised to  follow-up with OB/GYN as needed.  Portions of this note were generated with Lobbyist. Dictation errors may occur despite best attempts at proofreading.  Final Clinical Impression(s) / ED Diagnoses Final diagnoses:  Gastroesophageal reflux disease without esophagitis    Rx / DC Orders ED Discharge Orders    None       Janeece Fitting, PA-C 12/03/19 1114    Lacretia Leigh, MD 12/04/19 [phone]

## 2019-12-03 NOTE — Discharge Instructions (Addendum)
Your laboratory results are within normal limits.  Please continue Prilosec to help with your symptoms. You may also try Nexium over the counter, but do not combine the therapy.  Avoid spicy foods, attempt to eat smaller meals, and follow up with OBGYN as scheduled.

## 2019-12-27 ENCOUNTER — Telehealth: Payer: Self-pay | Admitting: Family Medicine

## 2019-12-27 NOTE — Telephone Encounter (Signed)
Pt called and said that her previos appt Dr Ethelene Hal talked about putting her on predisone but said she needed to have it approved by OB/GYN since she was pregnant, pt said that it was approved so she wanted to know what she needs to do next. Please advise.

## 2019-12-27 NOTE — Telephone Encounter (Signed)
That visit was over a month ago. At this point, I believe that she should follow up with her Ob.

## 2019-12-31 ENCOUNTER — Encounter: Payer: Self-pay | Admitting: Family Medicine

## 2020-01-02 NOTE — Telephone Encounter (Signed)
Called left message on identified VM with recommendation below. Also asked patient to give [place] a call with any other questions.

## 2020-01-03 ENCOUNTER — Encounter: Payer: Self-pay | Admitting: Family Medicine

## 2020-01-03 ENCOUNTER — Telehealth (INDEPENDENT_AMBULATORY_CARE_PROVIDER_SITE_OTHER): Payer: 59 | Admitting: Family Medicine

## 2020-01-03 VITALS — Temp 98.0°F | Ht 65.0 in | Wt 168.0 lb

## 2020-01-03 DIAGNOSIS — J301 Allergic rhinitis due to pollen: Secondary | ICD-10-CM

## 2020-01-03 DIAGNOSIS — R05 Cough: Secondary | ICD-10-CM | POA: Diagnosis not present

## 2020-01-03 DIAGNOSIS — R059 Cough, unspecified: Secondary | ICD-10-CM

## 2020-01-03 MED ORDER — PREDNISONE 20 MG PO TABS: 20.0000 mg | ORAL_TABLET | Freq: Two times a day (BID) | ORAL | 0 refills | Status: AC

## 2020-01-03 NOTE — Progress Notes (Signed)
I really needed to save her backs I really needed to see her for this I guess because she had a cough related okay   Established Patient Office Visit  Subjective:  Patient ID: Linda Guerra, female    DOB: October 05, 1985  Age: 34 y.o. MRN: [id number]  CC:  Chief Complaint  Patient presents with  . Follow-up    pt states that she still has a dry cough, states that she spoke with her gynecologist and was cleared to take prednisone for this cough.     HPI Linda Guerra presents for follow-up of her cough.  It is dry.  There has been no fever or chills.  Patient has no wheezing or tightness in her chest.  Her mother did confirm that she had asthma as a child but appeared to grow out of it.  Patient is dealing with allergy rhinitis symptoms to include itchy watery eyes nasal congestion with some post nasal drip.  At this point she has only using nasal saline.  She is dealing with increased reflux treated with Tums.  Tells me that opiates cleared her short course of oral prednisone.  Received her first Covid vaccine this past weekend.  She is now [redacted] weeks pregnant.  Pregnancy is going well.  Baby has been developing normally.  Past Medical History:  Diagnosis Date  . COVID-19   . Uterine fibroid     No past surgical history on file.  Family History  Problem Relation Age of Onset  . Healthy Mother   . Healthy Father     Social History   Socioeconomic History  . Marital status: Married    Spouse name: Not on file  . Number of children: Not on file  . Years of education: Not on file  . Highest education level: Not on file  Occupational History  . Not on file  Tobacco Use  . Smoking status: Never Smoker  . Smokeless tobacco: Never Used  Substance and Sexual Activity  . Alcohol use: Not Currently  . Drug use: Not Currently  . Sexual activity: Yes  Other Topics Concern  . Not on file  Social History Narrative  . Not on file   Social Determinants of Health   Financial Resource  Strain:   . Difficulty of Paying Living Expenses:   Food Insecurity:   . Worried About Charity fundraiser in the Last Year:   . Arboriculturist in the Last Year:   Transportation Needs:   . Film/video editor (Medical):   Marland Kitchen Lack of Transportation (Non-Medical):   Physical Activity:   . Days of Exercise per Week:   . Minutes of Exercise per Session:   Stress:   . Feeling of Stress :   Social Connections:   . Frequency of Communication with Friends and Family:   . Frequency of Social Gatherings with Friends and Family:   . Attends Religious Services:   . Active Member of Clubs or Organizations:   . Attends Archivist Meetings:   Marland Kitchen Marital Status:   Intimate Partner Violence:   . Fear of Current or Ex-Partner:   . Emotionally Abused:   Marland Kitchen Physically Abused:   . Sexually Abused:     Outpatient Medications Prior to Visit  Medication Sig Dispense Refill  . Prenatal Vit-Fe Fumarate-FA (MULTIVITAMIN-PRENATAL) 27-0.8 MG TABS tablet Take 1 tablet by mouth daily.     . sodium chloride (OCEAN) 0.65 % SOLN nasal spray Place 1 spray into both  nostrils as needed for congestion.    . calcium carbonate (TUMS - DOSED IN MG ELEMENTAL CALCIUM) 500 MG chewable tablet Chew 1 tablet by mouth 2 (two) times daily as needed for indigestion or heartburn.    Marland Kitchen guaiFENesin (ROBITUSSIN) 100 MG/5ML liquid Take 200 mg by mouth 3 (three) times daily as needed for cough.    Marland Kitchen omeprazole (PRILOSEC) 20 MG capsule Take 20 mg by mouth daily as needed (indigestion).     No facility-administered medications prior to visit.    No Known Allergies  ROS Review of Systems  Constitutional: Negative.   HENT: Positive for congestion, postnasal drip, rhinorrhea and sneezing.   Eyes: Negative for photophobia and visual disturbance.  Respiratory: Positive for cough. Negative for shortness of breath and wheezing.   Cardiovascular: Negative.   Gastrointestinal: Negative.   Endocrine: Negative for polyphagia  and polyuria.  Musculoskeletal: Negative for gait problem and joint swelling.  Neurological: Negative for weakness and numbness.  Psychiatric/Behavioral: Negative.       Objective:    Physical Exam  Constitutional: She is oriented to person, place, and time. She appears well-developed and well-nourished. No distress.  HENT:  Head: Normocephalic and atraumatic.  Right Ear: External ear normal.  Left Ear: External ear normal.  Eyes: Conjunctivae are normal. Right eye exhibits no discharge. Left eye exhibits no discharge. No scleral icterus.  Pulmonary/Chest: Effort normal.  Neurological: She is alert and oriented to person, place, and time.  Skin: She is not diaphoretic.  Psychiatric: She has a normal mood and affect. Her behavior is normal.    Temp 98 F (36.7 C) (Tympanic)   Ht 5\' 5"  (1.651 m)   Wt 168 lb (76.2 kg) Comment: per pt  LMP 09/09/2019   BMI 27.96 kg/m  Wt Readings from Last 3 Encounters:  01/03/20 168 lb (76.2 kg)  12/03/19 167 lb (75.8 kg)  11/21/19 167 lb (75.8 kg)     Health Maintenance Due  Topic Date Due  . HIV Screening  Never done  . COVID-19 Vaccine (1) Never done  . TETANUS/TDAP  Never done  . PAP SMEAR-Modifier  Never done    There are no preventive care reminders to display for this patient.  No results found for: TSH Lab Results  Component Value Date   WBC 11.2 (H) 12/03/2019   HGB 13.4 12/03/2019   HCT 39.8 12/03/2019   MCV 95.2 12/03/2019   PLT 226 12/03/2019   Lab Results  Component Value Date   NA 137 12/03/2019   K 3.9 12/03/2019   CO2 23 12/03/2019   GLUCOSE 114 (H) 12/03/2019   BUN 13 12/03/2019   CREATININE 0.47 12/03/2019   BILITOT 0.3 12/03/2019   ALKPHOS 56 12/03/2019   AST 22 12/03/2019   ALT 28 12/03/2019   PROT 7.8 12/03/2019   ALBUMIN 4.3 12/03/2019   CALCIUM 9.6 12/03/2019   ANIONGAP 9 12/03/2019   No results found for: CHOL No results found for: HDL No results found for: LDLCALC No results found for:  TRIG No results found for: CHOLHDL No results found for: HGBA1C    Assessment & Plan:   Problem List Items Addressed This Visit      Respiratory   Seasonal allergic rhinitis due to pollen   Relevant Medications   predniSONE (DELTASONE) 20 MG tablet     Other   Cough - Primary   Relevant Medications   predniSONE (DELTASONE) 20 MG tablet      Meds ordered this encounter  Medications  . predniSONE (DELTASONE) 20 MG tablet    Sig: Take 1 tablet (20 mg total) by mouth 2 (two) times daily with a meal for 7 days.    Dispense:  14 tablet    Refill:  0    Follow-up: Return if symptoms worsen or fail to improve, for may consider nasal steroids after clearing with OB. Libby Maw, MD

## 2020-01-07 IMAGING — CT CT ABD-PELV W/ CM
2 of 4 series · 17 of 46 positions shown, 19 images · IV contrast (Omni 300)
Comparison: None.

CLINICAL DATA: Abdominal pain. Suspected appendicitis.

EXAM:
CT ABDOMEN AND PELVIS WITH CONTRAST
TECHNIQUE: Multidetector CT imaging of the abdomen and pelvis was performed
using the standard protocol following bolus administration of
intravenous contrast.
CONTRAST:  100mL OMNIPAQUE IOHEXOL 300 MG/ML  SOLN

[Series 3: a/p w/ 5mm · axial · 0.92mm/px · z∈[+773,+1233]mm · 14 of 100 slices shown, 16 images]
[im 4/100  soft-tissue]
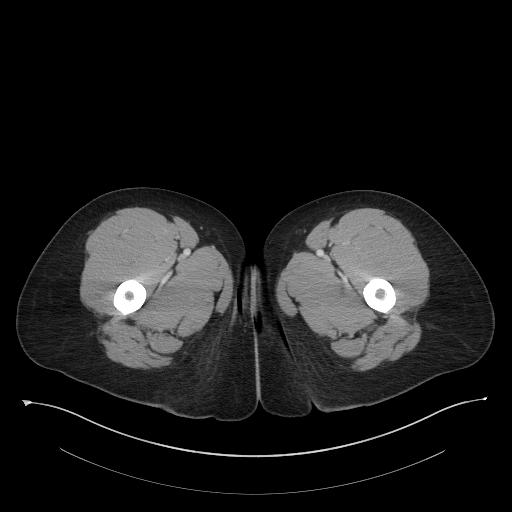
[im 4/100  bone]
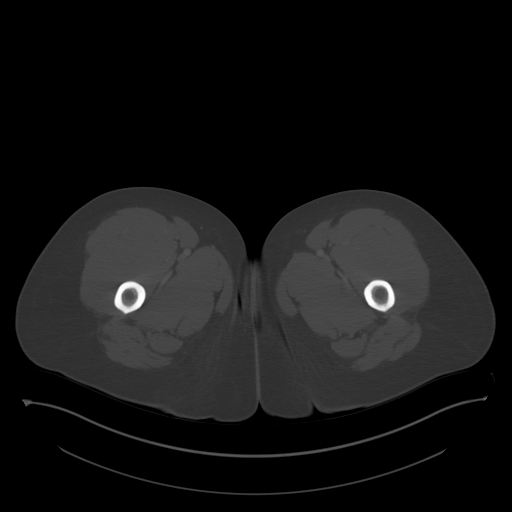
[im 12/100  soft-tissue]
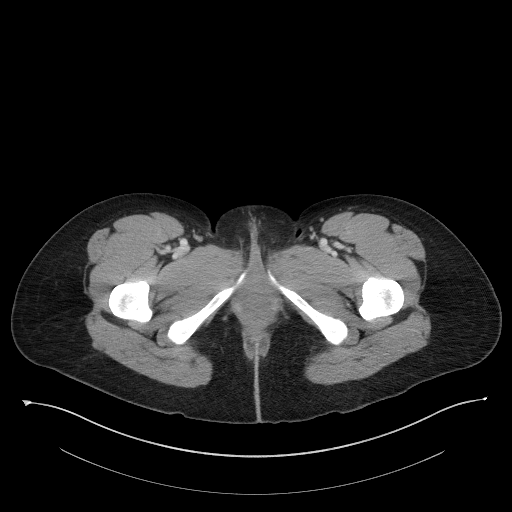
[im 20/100  soft-tissue]
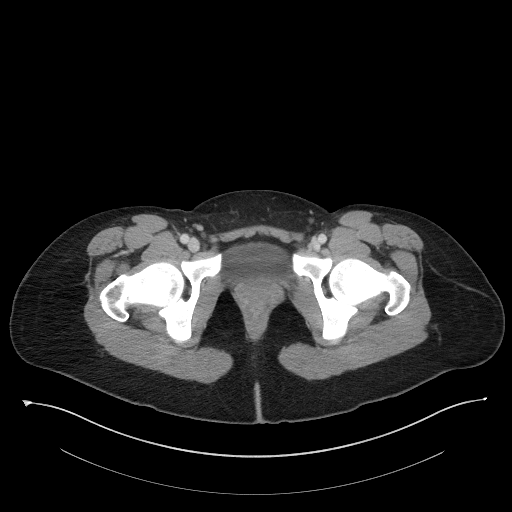
[im 28/100  soft-tissue]
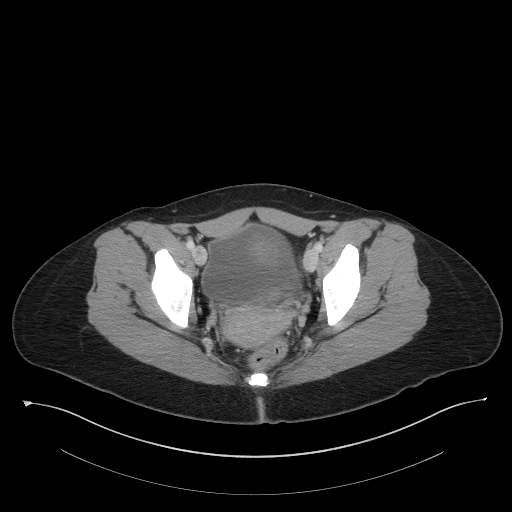
[im 32/100  soft-tissue]
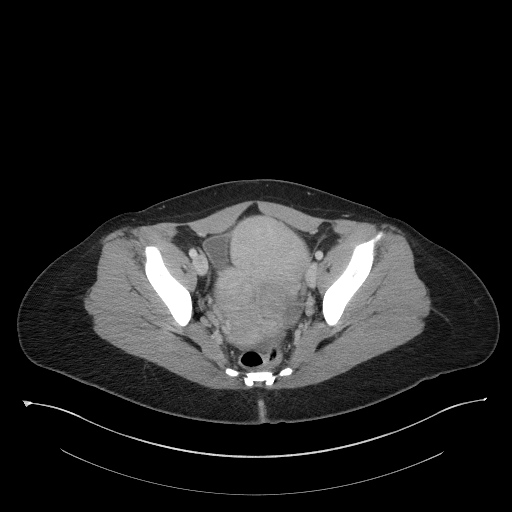
[im 40/100  soft-tissue]
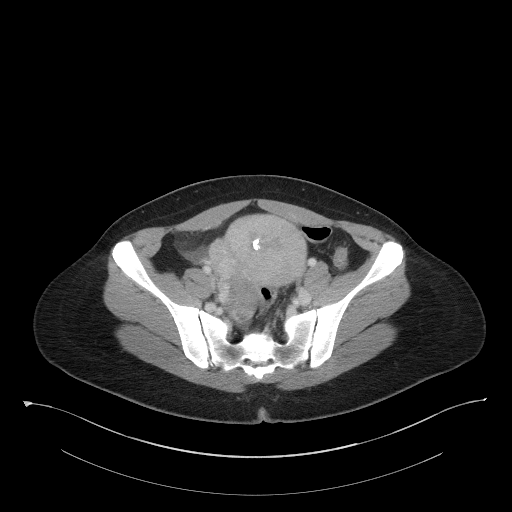
[im 48/100  soft-tissue]
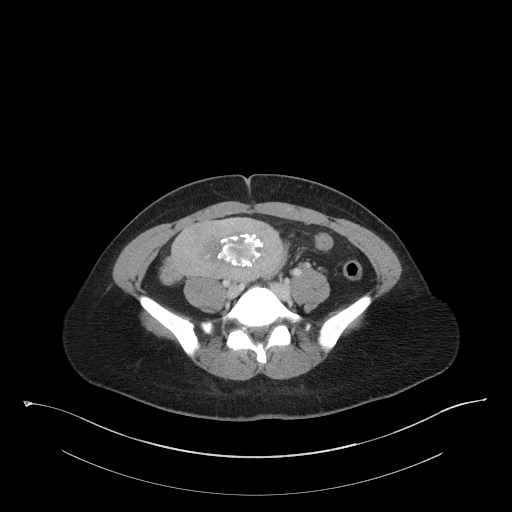
[im 52/100  soft-tissue]
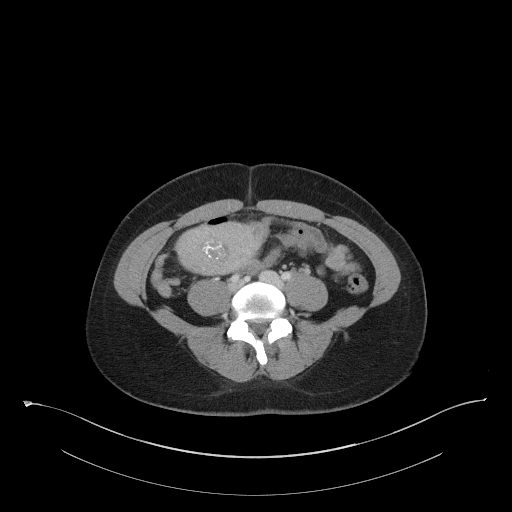
[im 60/100  soft-tissue]
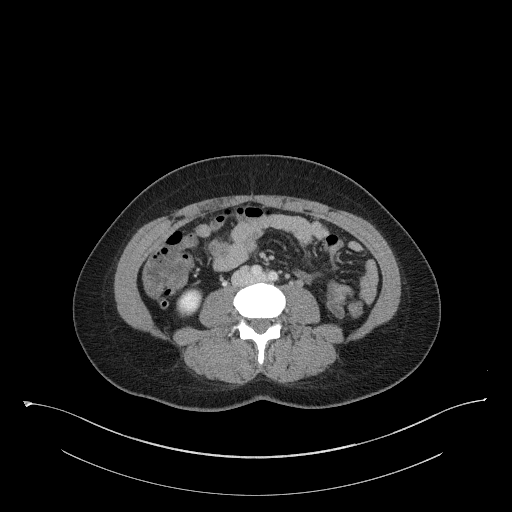
[im 60/100  bone]
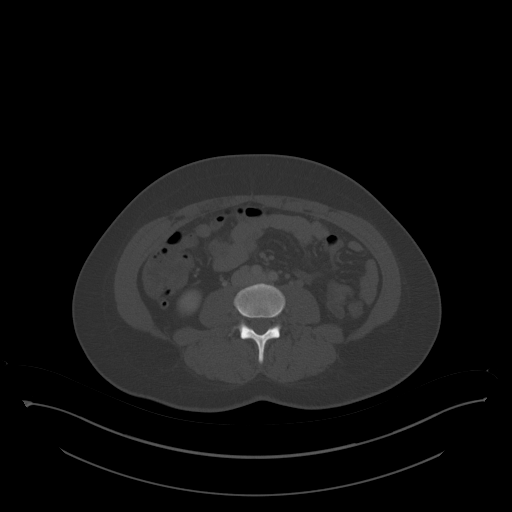
[im 68/100  soft-tissue]
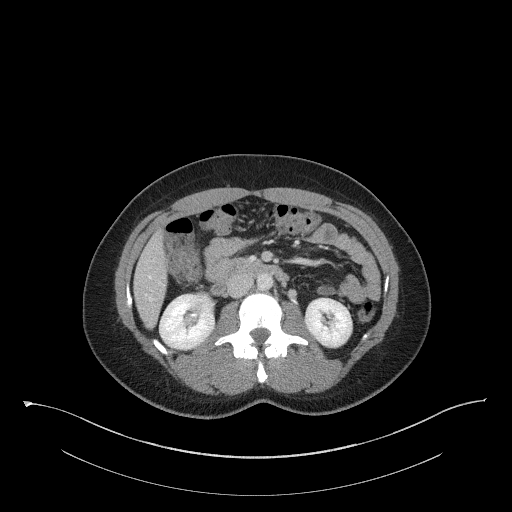
[im 76/100  soft-tissue]
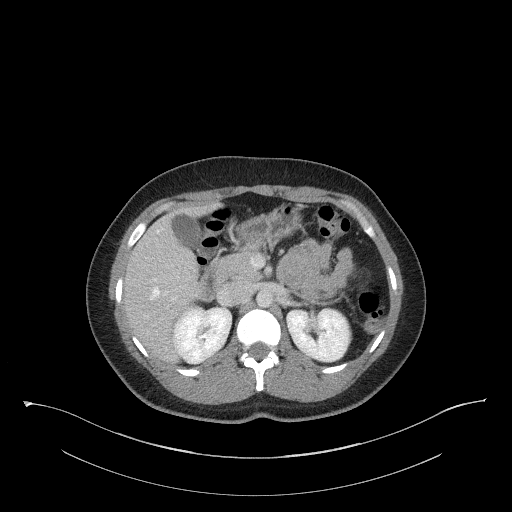
[im 80/100  soft-tissue]
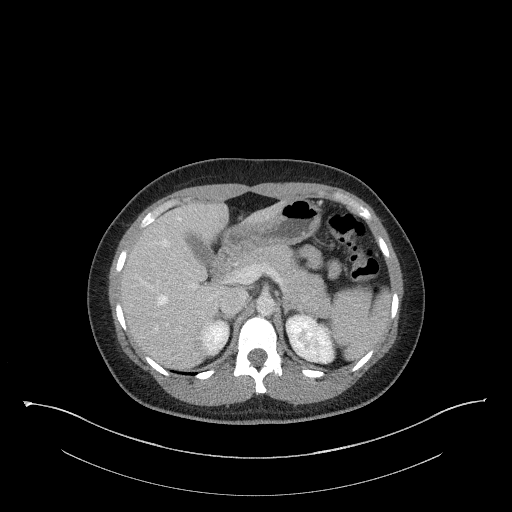
[im 88/100  soft-tissue]
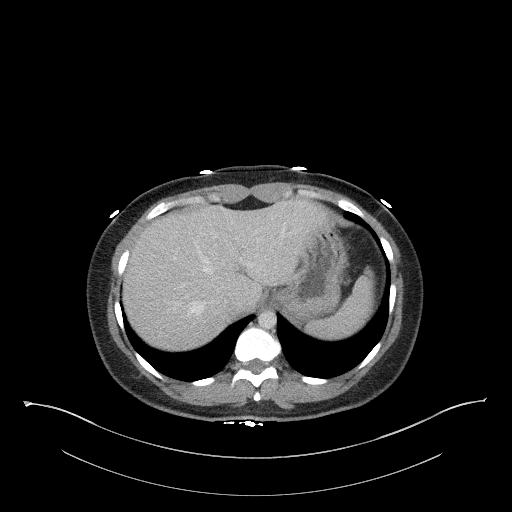
[im 96/100  soft-tissue]
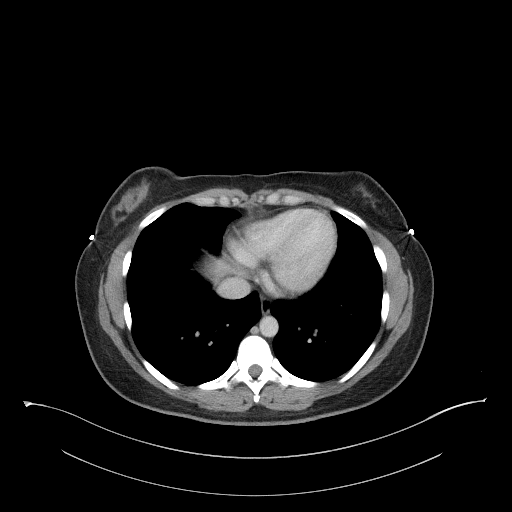

[Series 6: a/p w/ cor · coronal · 0.97mm/px · 3 of 149 slices shown]
[im 50/149  soft-tissue]
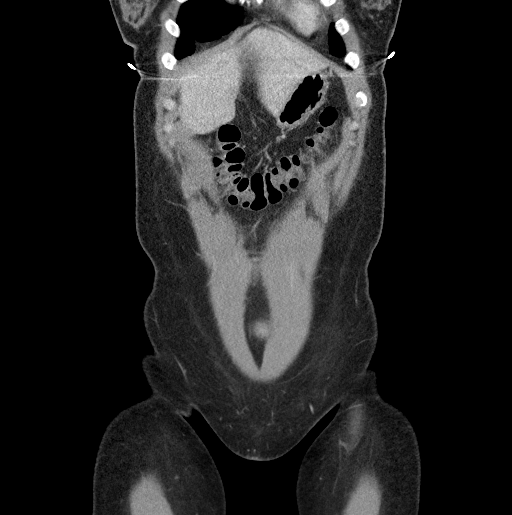
[im 66/149  soft-tissue]
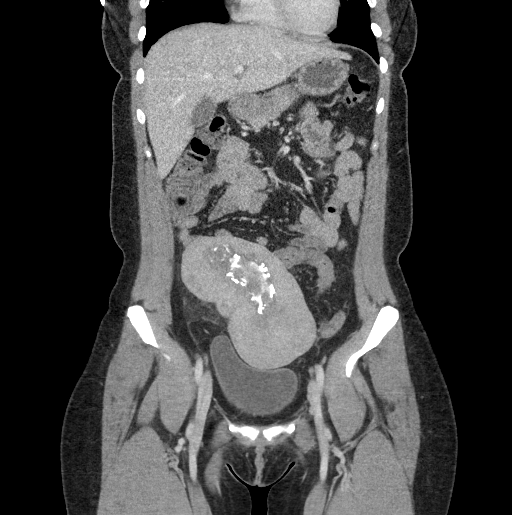
[im 83/149  soft-tissue]
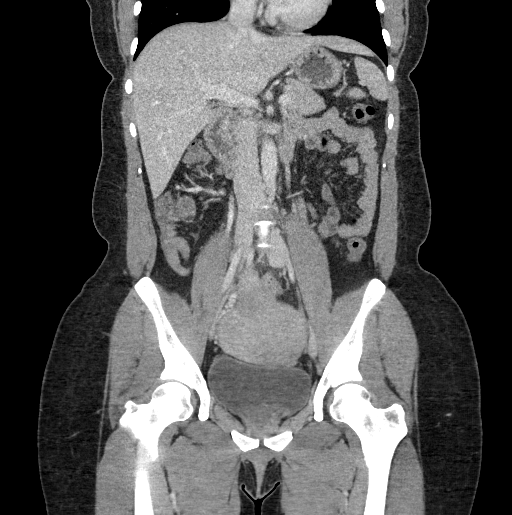

[17 of 46 positions shown; findings below may reference images not displayed]

FINDINGS: Lower Chest: No acute findings.

Hepatobiliary: No hepatic masses identified. Gallbladder is
unremarkable. No evidence of biliary ductal dilatation.

Pancreas:  No mass or inflammatory changes.

Spleen: Within normal limits in size and appearance.

Adrenals/Urinary Tract: No masses identified. No evidence of
hydronephrosis.

Stomach/Bowel: No evidence of obstruction, inflammatory process or
abnormal fluid collections. Normal appendix visualized.

Vascular/Lymphatic: No pathologically enlarged lymph nodes. No
abdominal aortic aneurysm.

Reproductive: Uterine fibroids are seen with largest projecting
exophytically from the uterine fundus into the lower abdomen,
measuring 15.6 x 9.1 cm. This largest fibroid shows central
calcification. A small right ovarian corpus luteum cyst is also seen
measuring approximately 2 cm, and a small amount of free fluid is
seen in the right adnexa.

Other:  None.

Musculoskeletal:  No suspicious bone lesions identified.
IMPRESSION: No evidence of appendicitis.

Small right ovarian corpus luteum cyst and small amount of free
fluid, most likely physiologic in a reproductive age female.

Uterine fibroids, largest measuring 15.6 cm.

## 2020-04-11 IMAGING — DX DG CHEST 1V PORT
1 series · 1 of 1 positions shown · non-contrast
Comparison: 09/17/2019

CLINICAL DATA: Cough.  COVID positive.

EXAM:
PORTABLE CHEST 1 VIEW

[chest ap]
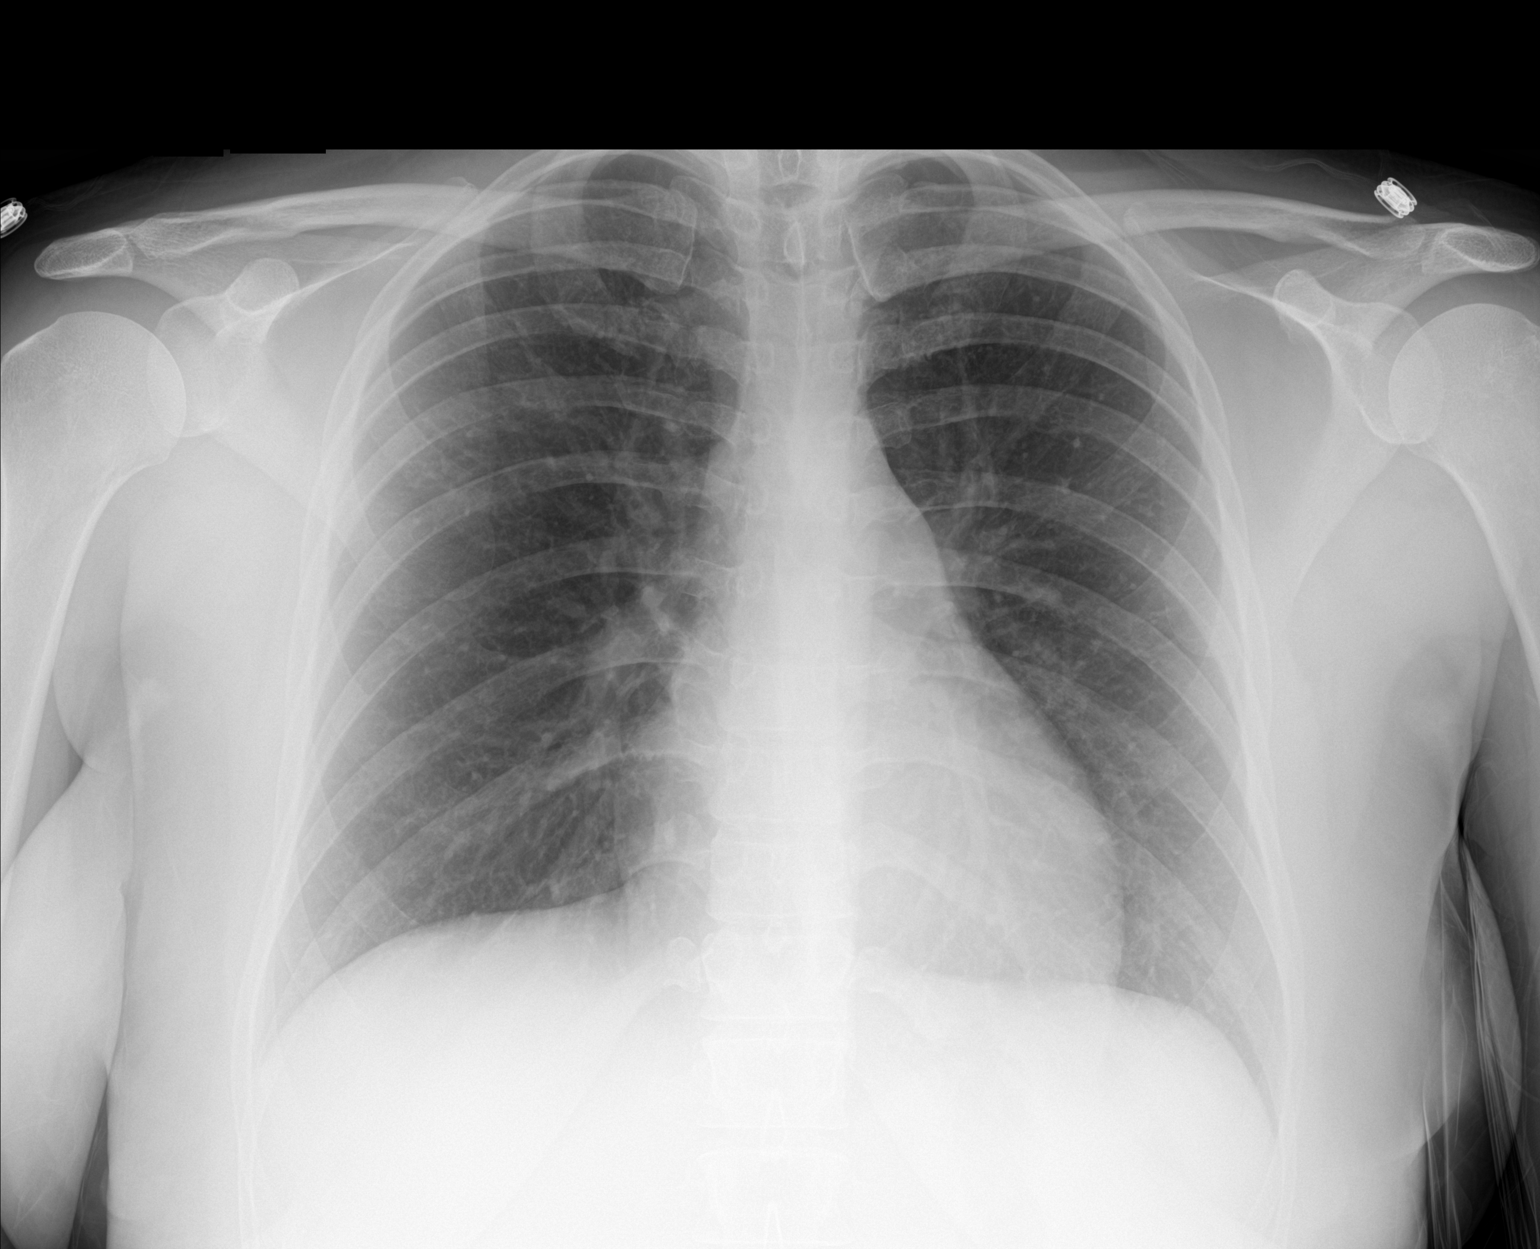

[1 of 1 positions shown; findings below may reference images not displayed]

FINDINGS: The cardiomediastinal silhouette is unremarkable.

There is no evidence of focal airspace disease, pulmonary edema,
suspicious pulmonary nodule/mass, pleural effusion, or pneumothorax.

No acute bony abnormalities are identified.
IMPRESSION: No active disease.

## 2020-05-28 ENCOUNTER — Other Ambulatory Visit: Payer: Self-pay

## 2020-05-28 ENCOUNTER — Inpatient Hospital Stay (HOSPITAL_COMMUNITY)
Admission: AD | Admit: 2020-05-28 | Discharge: 2020-06-01 | DRG: 806 | Disposition: A | Discharge status: Home / Self Care | Payer: 59 | Attending: Obstetrics and Gynecology | Admitting: Obstetrics and Gynecology

## 2020-05-28 ENCOUNTER — Inpatient Hospital Stay (HOSPITAL_COMMUNITY)
Admission: AD | Admit: 2020-05-28 | Discharge: 2020-05-28 | Disposition: A | Discharge status: Home / Self Care | Payer: Self-pay | Attending: Obstetrics and Gynecology | Admitting: Obstetrics and Gynecology

## 2020-05-28 ENCOUNTER — Encounter (HOSPITAL_COMMUNITY): Payer: Self-pay | Admitting: Obstetrics and Gynecology

## 2020-05-28 DIAGNOSIS — Z3A37 37 weeks gestation of pregnancy: Secondary | ICD-10-CM | POA: Diagnosis not present

## 2020-05-28 DIAGNOSIS — Z8616 Personal history of COVID-19: Secondary | ICD-10-CM | POA: Diagnosis not present

## 2020-05-28 DIAGNOSIS — O3413 Maternal care for benign tumor of corpus uteri, third trimester: Secondary | ICD-10-CM | POA: Diagnosis present

## 2020-05-28 DIAGNOSIS — D259 Leiomyoma of uterus, unspecified: Secondary | ICD-10-CM | POA: Diagnosis present

## 2020-05-28 DIAGNOSIS — O4292 Full-term premature rupture of membranes, unspecified as to length of time between rupture and onset of labor: Secondary | ICD-10-CM | POA: Diagnosis present

## 2020-05-28 DIAGNOSIS — Z20822 Contact with and (suspected) exposure to covid-19: Secondary | ICD-10-CM | POA: Diagnosis present

## 2020-05-28 LAB — CBC
HCT: 34.9 % — ABNORMAL LOW (ref 36.0–46.0)
Hemoglobin: 11.5 g/dL — ABNORMAL LOW (ref 12.0–15.0)
MCH: 29.8 pg (ref 26.0–34.0)
MCHC: 33 g/dL (ref 30.0–36.0)
MCV: 90.4 fL (ref 80.0–100.0)
Platelets: 311 10*3/uL (ref 150–400)
RBC: 3.86 MIL/uL — ABNORMAL LOW (ref 3.87–5.11)
RDW: 13.3 % (ref 11.5–15.5)
WBC: 7.1 10*3/uL (ref 4.0–10.5)
nRBC: 0 % (ref 0.0–0.2)

## 2020-05-28 LAB — OB RESULTS CONSOLE GBS: GBS: NEGATIVE

## 2020-05-28 LAB — SARS CORONAVIRUS 2 BY RT PCR (HOSPITAL ORDER, PERFORMED IN ~~LOC~~ HOSPITAL LAB): SARS Coronavirus 2: NEGATIVE

## 2020-05-28 LAB — POCT FERN TEST: POCT Fern Test: POSITIVE

## 2020-05-28 MED ORDER — MISOPROSTOL 50MCG HALF TABLET
50.0000 ug | ORAL_TABLET | ORAL | Status: DC
  Administered 2020-05-28 – 2020-05-29 (×2): 50 ug via ORAL
  Filled 2020-05-28 (×2): qty 1

## 2020-05-28 MED ORDER — FENTANYL CITRATE (PF) 100 MCG/2ML IJ SOLN: 50.0000 ug | INTRAMUSCULAR | Status: DC | PRN

## 2020-05-28 MED ORDER — LACTATED RINGERS IV SOLN: INTRAVENOUS | Status: DC

## 2020-05-28 MED ORDER — OXYCODONE-ACETAMINOPHEN 5-325 MG PO TABS: 1.0000 | ORAL_TABLET | ORAL | Status: DC | PRN

## 2020-05-28 MED ORDER — LACTATED RINGERS IV SOLN
500.0000 mL | Freq: Once | INTRAVENOUS | Status: AC
  Administered 2020-05-29: 500 mL via INTRAVENOUS

## 2020-05-28 MED ORDER — OXYCODONE-ACETAMINOPHEN 5-325 MG PO TABS: 2.0000 | ORAL_TABLET | ORAL | Status: DC | PRN

## 2020-05-28 MED ORDER — PHENYLEPHRINE 40 MCG/ML (10ML) SYRINGE FOR IV PUSH (FOR BLOOD PRESSURE SUPPORT)
80.0000 ug | PREFILLED_SYRINGE | INTRAVENOUS | Status: DC | PRN
  Filled 2020-05-28 (×2): qty 10

## 2020-05-28 MED ORDER — FENTANYL-BUPIVACAINE-NACL 0.5-0.125-0.9 MG/250ML-% EP SOLN
12.0000 mL/h | EPIDURAL | Status: DC | PRN
  Filled 2020-05-28: qty 250

## 2020-05-28 MED ORDER — ACETAMINOPHEN 325 MG PO TABS: 650.0000 mg | ORAL_TABLET | ORAL | Status: DC | PRN

## 2020-05-28 MED ORDER — EPHEDRINE 5 MG/ML INJ: 10.0000 mg | INTRAVENOUS | Status: DC | PRN

## 2020-05-28 MED ORDER — ONDANSETRON HCL 4 MG/2ML IJ SOLN
4.0000 mg | Freq: Four times a day (QID) | INTRAMUSCULAR | Status: DC | PRN
  Administered 2020-05-29: 4 mg via INTRAVENOUS
  Filled 2020-05-28: qty 2

## 2020-05-28 MED ORDER — TERBUTALINE SULFATE 1 MG/ML IJ SOLN: 0.2500 mg | Freq: Once | INTRAMUSCULAR | Status: DC | PRN

## 2020-05-28 MED ORDER — LACTATED RINGERS IV SOLN
500.0000 mL | INTRAVENOUS | Status: DC | PRN
  Administered 2020-05-28: 1000 mL via INTRAVENOUS

## 2020-05-28 MED ORDER — PHENYLEPHRINE 40 MCG/ML (10ML) SYRINGE FOR IV PUSH (FOR BLOOD PRESSURE SUPPORT)
80.0000 ug | PREFILLED_SYRINGE | INTRAVENOUS | Status: DC | PRN
  Filled 2020-05-28: qty 10

## 2020-05-28 MED ORDER — OXYTOCIN BOLUS FROM INFUSION
333.0000 mL | Freq: Once | INTRAVENOUS | Status: AC
  Administered 2020-05-30: 333 mL via INTRAVENOUS

## 2020-05-28 MED ORDER — OXYTOCIN-SODIUM CHLORIDE 30-0.9 UT/500ML-% IV SOLN
2.5000 [IU]/h | INTRAVENOUS | Status: DC
  Administered 2020-05-30: 2.5 [IU]/h via INTRAVENOUS
  Filled 2020-05-28 (×2): qty 500

## 2020-05-28 MED ORDER — OXYTOCIN-SODIUM CHLORIDE 30-0.9 UT/500ML-% IV SOLN
1.0000 m[IU]/min | INTRAVENOUS | Status: DC
  Filled 2020-05-28: qty 500

## 2020-05-28 MED ORDER — DIPHENHYDRAMINE HCL 50 MG/ML IJ SOLN: 12.5000 mg | INTRAMUSCULAR | Status: DC | PRN

## 2020-05-28 MED ORDER — LIDOCAINE HCL (PF) 1 % IJ SOLN: 30.0000 mL | INTRAMUSCULAR | Status: DC | PRN

## 2020-05-28 MED ORDER — SOD CITRATE-CITRIC ACID 500-334 MG/5ML PO SOLN: 30.0000 mL | ORAL | Status: DC | PRN

## 2020-05-28 MED ORDER — MISOPROSTOL 25 MCG QUARTER TABLET
25.0000 ug | ORAL_TABLET | ORAL | Status: AC
  Administered 2020-05-28: 25 ug via ORAL
  Filled 2020-05-28: qty 1

## 2020-05-28 NOTE — H&P (Signed)
Linda Guerra is a 34 y.o. female G6P0 [redacted]w[redacted]d presenting for augmentation for premature rupture of membranes at 1445 this afternoon. She reports clear LOF and denies VB, Contractions. Normal FM.   Rupture of membranes confirmed by MAU.  Pregnancy c/b: 1. Low lying placenta - resolved on scan in office today 2. 16cm pedunculated fundal fibroid - followed with growth [place] throughout pregnancy   OB History    Gravida  1   Para      Term      Preterm      AB      Living        SAB      TAB      Ectopic      Multiple      Live Births             Past Medical History:  Diagnosis Date  . COVID-19   . Uterine fibroid    Past Surgical History:  Procedure Laterality Date  . NO PAST SURGERIES     Family History: family history includes Healthy in her father and mother. Social History:  reports that she has never smoked. She has never used smokeless tobacco. She reports previous alcohol use. She reports previous drug use.     Maternal Diabetes: No Genetic Screening: Normal  Fetal Ultrasounds or other Referrals:  Other: [facility] for anatomy scan secondary to placenta previa and fibroids Maternal Substance Abuse:  No Significant Maternal Medications:  None Significant Maternal Lab Results:  Group B Strep negative Other Comments:  None  Review of Systems Per HPI Exam Physical Exam  Dilation: 1 Effacement (%): Thick Station: -3 Exam by:: Wilford Sports Blood pressure 122/80, pulse 65, temperature 97.8 F (36.6 C), temperature source Oral, resp. rate 16, height 5\' 5"  (1.651 m), weight 78.9 kg, last menstrual period 09/09/2019, SpO2 98 %, unknown if currently breastfeeding. NAD, resting comfortably Gravid abdomen, unable to assess EFW by Le[place] due to fundal fibroid Fetal testing: FHR 130bpm, mod variability + accels, no decels Toco: ctx q 3-4 mins Prenatal labs: ABO, Rh:  --/--/O POS (09/22 1700) Antibody: NEG (09/22 1700) Rubella: Immune (03/24 0000) RPR:  Nonreactive (03/24 0000)  HBsAg: Negative (03/24 0000)  HIV: Non-reactive (03/24 0000)  GBS: Negative/-- (09/22 0000)   Assessment/Plan: 34Y G1P0 @ [redacted]w[redacted]d, admitted for labor augmentation due to premature ROM  Labor: after discussion with patient regarding low dose pitocin vs. Buccal misoprostol, patient would prefer misoprostol. First dose given at 1850, cont q 4hr PRN cervical ripening    Rowland Lathe 05/28/2020, 10:56 PM

## 2020-05-28 NOTE — MAU Note (Signed)
Patient reported to MAU at [redacted]w[redacted]d gestation with c/o of SROM at 1445, clear fluid. Patient denies having vaginal bleeding, patient feels fetal movement, and patient reports occasional contractions.

## 2020-05-29 ENCOUNTER — Inpatient Hospital Stay (HOSPITAL_COMMUNITY): Payer: 59 | Admitting: Anesthesiology

## 2020-05-29 LAB — RPR: RPR Ser Ql: NONREACTIVE

## 2020-05-29 MED ORDER — SODIUM CHLORIDE (PF) 0.9 % IJ SOLN
INTRAMUSCULAR | Status: DC | PRN
Start: 2020-05-29 — End: 2020-05-30
  Administered 2020-05-29: 12 mL/h via EPIDURAL

## 2020-05-29 MED ORDER — LIDOCAINE HCL (PF) 1 % IJ SOLN
INTRAMUSCULAR | Status: DC | PRN
  Administered 2020-05-29: 11 mL via EPIDURAL

## 2020-05-29 MED ORDER — TERBUTALINE SULFATE 1 MG/ML IJ SOLN: 0.2500 mg | Freq: Once | INTRAMUSCULAR | Status: DC | PRN

## 2020-05-29 MED ORDER — OXYTOCIN-SODIUM CHLORIDE 30-0.9 UT/500ML-% IV SOLN
1.0000 m[IU]/min | INTRAVENOUS | Status: DC
  Administered 2020-05-29: 2 m[IU]/min via INTRAVENOUS

## 2020-05-29 NOTE — Progress Notes (Signed)
Patient ID: Linda Guerra, female   DOB: 29-Jul-1986, 34 y.o.   MRN: [id number]  S: Feeling some contractions O:  Vitals:   05/29/20 0726 05/29/20 0949 05/29/20 0952 05/29/20 1111  BP: 114/81  110/75 (!) 100/56  Pulse: 67  69 69  Resp: 16 16  16   Temp:  97.9 F (36.6 C)  97.9 F (36.6 C)  TempSrc:    Oral  SpO2:      Weight:      Height:       AOx3, NAD,  Abd soft FHR 130-140s reactive, cat 1 tracing cvx 1/th/-3/-2 post toco irreg  A/P 1) Augmentation: s/p 3 doses buccal cytotec, 25, 50, 50. No significant change. Management options discussed. R/B?A reviewed. Recommended proceeding with pitocin augmentation 2) FWB reassuring

## 2020-05-29 NOTE — Anesthesia Procedure Notes (Signed)
Epidural Patient location during procedure: OB Start time: 05/29/2020 4:08 PM End time: 05/29/2020 4:24 PM  Staffing Anesthesiologist: Lynda Rainwater, MD Performed: anesthesiologist   Preanesthetic Checklist Completed: patient identified, IV checked, site marked, risks and benefits discussed, surgical consent, monitors and equipment checked, pre-op evaluation and timeout performed  Epidural Patient position: sitting Prep: ChloraPrep Patient monitoring: heart rate, cardiac monitor, continuous pulse ox and blood pressure Approach: midline Location: L2-L3 Injection technique: LOR saline  Needle:  Needle type: Tuohy  Needle gauge: 17 G Needle length: 9 cm Needle insertion depth: 6 cm Catheter type: closed end flexible Catheter size: 20 Guage Catheter at skin depth: 10 cm Test dose: negative  Assessment Events: blood not aspirated, injection not painful, no injection resistance, no paresthesia and negative IV test  Additional Notes Reason for block:procedure for pain

## 2020-05-29 NOTE — Anesthesia Preprocedure Evaluation (Signed)

## 2020-05-30 ENCOUNTER — Encounter (HOSPITAL_COMMUNITY): Payer: Self-pay | Admitting: Obstetrics and Gynecology

## 2020-05-30 LAB — DIC (DISSEMINATED INTRAVASCULAR COAGULATION)PANEL
D-Dimer, Quant: 2.15 ug/mL-FEU — ABNORMAL HIGH (ref 0.00–0.50)
Fibrinogen: 714 mg/dL — ABNORMAL HIGH (ref 210–475)
INR: 1 (ref 0.8–1.2)
Platelets: 289 10*3/uL (ref 150–400)
Prothrombin Time: 12.9 seconds (ref 11.4–15.2)
Smear Review: NONE SEEN
aPTT: 31 seconds (ref 24–36)

## 2020-05-30 LAB — CBC
HCT: 35 % — ABNORMAL LOW (ref 36.0–46.0)
HCT: 27 % — ABNORMAL LOW (ref 36.0–46.0)
Hemoglobin: 12 g/dL (ref 12.0–15.0)
Hemoglobin: 9.2 g/dL — ABNORMAL LOW (ref 12.0–15.0)
MCH: 31.2 pg (ref 26.0–34.0)
MCH: 30.2 pg (ref 26.0–34.0)
MCHC: 34.3 g/dL (ref 30.0–36.0)
MCHC: 34.1 g/dL (ref 30.0–36.0)
MCV: 90.9 fL (ref 80.0–100.0)
MCV: 88.5 fL (ref 80.0–100.0)
Platelets: 296 10*3/uL (ref 150–400)
Platelets: 234 10*3/uL (ref 150–400)
RBC: 3.85 MIL/uL — ABNORMAL LOW (ref 3.87–5.11)
RBC: 3.05 MIL/uL — ABNORMAL LOW (ref 3.87–5.11)
RDW: 13.3 % (ref 11.5–15.5)
RDW: 13.3 % (ref 11.5–15.5)
WBC: 10.1 10*3/uL (ref 4.0–10.5)
WBC: 15.2 10*3/uL — ABNORMAL HIGH (ref 4.0–10.5)
nRBC: 0 % (ref 0.0–0.2)
nRBC: 0 % (ref 0.0–0.2)

## 2020-05-30 LAB — ABO/RH: ABO/RH(D): O POS

## 2020-05-30 LAB — MASSIVE TRANSFUSION PROTOCOL ORDER (BLOOD BANK NOTIFICATION)

## 2020-05-30 LAB — PREPARE RBC (CROSSMATCH)

## 2020-05-30 LAB — POSTPARTUM HEMORRHAGE PROTOCOL (BB NOTIFICATION)

## 2020-05-30 MED ORDER — PRENATAL MULTIVITAMIN CH
1.0000 | ORAL_TABLET | Freq: Every day | ORAL | Status: DC
  Administered 2020-05-30 – 2020-06-01 (×3): 1 via ORAL
  Filled 2020-05-30 (×3): qty 1

## 2020-05-30 MED ORDER — OXYCODONE HCL 5 MG PO TABS: 5.0000 mg | ORAL_TABLET | ORAL | Status: DC | PRN

## 2020-05-30 MED ORDER — LACTATED RINGERS IV SOLN: INTRAVENOUS | Status: DC

## 2020-05-30 MED ORDER — TRANEXAMIC ACID-NACL 1000-0.7 MG/100ML-% IV SOLN: 1000.0000 mg | Freq: Once | INTRAVENOUS | Status: AC

## 2020-05-30 MED ORDER — MISOPROSTOL 200 MCG PO TABS
1000.0000 ug | ORAL_TABLET | Freq: Once | ORAL | Status: AC
  Administered 2020-05-30: 1000 ug via RECTAL

## 2020-05-30 MED ORDER — SODIUM CHLORIDE 0.9% IV SOLUTION: Freq: Once | INTRAVENOUS | Status: AC

## 2020-05-30 MED ORDER — WITCH HAZEL-GLYCERIN EX PADS: 1.0000 "application " | MEDICATED_PAD | CUTANEOUS | Status: DC | PRN

## 2020-05-30 MED ORDER — COCONUT OIL OIL
1.0000 "application " | TOPICAL_OIL | Status: DC | PRN
  Administered 2020-05-30: 1 via TOPICAL

## 2020-05-30 MED ORDER — METHYLERGONOVINE MALEATE 0.2 MG PO TABS: 0.2000 mg | ORAL_TABLET | ORAL | Status: DC | PRN

## 2020-05-30 MED ORDER — CEFAZOLIN SODIUM-DEXTROSE 2-4 GM/100ML-% IV SOLN
2.0000 g | Freq: Three times a day (TID) | INTRAVENOUS | Status: AC
  Administered 2020-05-30 (×3): 2 g via INTRAVENOUS
  Filled 2020-05-30 (×3): qty 100

## 2020-05-30 MED ORDER — ONDANSETRON HCL 4 MG/2ML IJ SOLN: 4.0000 mg | INTRAMUSCULAR | Status: DC | PRN

## 2020-05-30 MED ORDER — ONDANSETRON HCL 4 MG/2ML IJ SOLN
INTRAMUSCULAR | Status: AC
  Administered 2020-05-30: 4 mg
  Filled 2020-05-30: qty 2

## 2020-05-30 MED ORDER — MISOPROSTOL 200 MCG PO TABS
ORAL_TABLET | ORAL | Status: AC
  Filled 2020-05-30: qty 5

## 2020-05-30 MED ORDER — SENNOSIDES-DOCUSATE SODIUM 8.6-50 MG PO TABS
2.0000 | ORAL_TABLET | ORAL | Status: DC
  Administered 2020-05-30 – 2020-06-01 (×2): 2 via ORAL
  Filled 2020-05-30 (×2): qty 2

## 2020-05-30 MED ORDER — ACETAMINOPHEN 325 MG PO TABS: 650.0000 mg | ORAL_TABLET | ORAL | Status: DC | PRN

## 2020-05-30 MED ORDER — TRANEXAMIC ACID-NACL 1000-0.7 MG/100ML-% IV SOLN
INTRAVENOUS | Status: AC
  Administered 2020-05-30: 1000 mg via INTRAVENOUS
  Filled 2020-05-30: qty 100

## 2020-05-30 MED ORDER — LACTATED RINGERS IV BOLUS: 1000.0000 mL | Freq: Once | INTRAVENOUS | Status: DC

## 2020-05-30 MED ORDER — CEFAZOLIN SODIUM 1 G IJ SOLR: 1.0000 g | Freq: Three times a day (TID) | INTRAMUSCULAR | Status: DC

## 2020-05-30 MED ORDER — DIPHENOXYLATE-ATROPINE 2.5-0.025 MG PO TABS: 2.0000 | ORAL_TABLET | Freq: Once | ORAL | Status: DC

## 2020-05-30 MED ORDER — METHYLERGONOVINE MALEATE 0.2 MG/ML IJ SOLN
INTRAMUSCULAR | Status: AC
  Filled 2020-05-30: qty 1

## 2020-05-30 MED ORDER — SIMETHICONE 80 MG PO CHEW: 80.0000 mg | CHEWABLE_TABLET | ORAL | Status: DC | PRN

## 2020-05-30 MED ORDER — IBUPROFEN 600 MG PO TABS
600.0000 mg | ORAL_TABLET | Freq: Four times a day (QID) | ORAL | Status: DC
  Administered 2020-05-30 – 2020-06-01 (×9): 600 mg via ORAL
  Filled 2020-05-30 (×9): qty 1

## 2020-05-30 MED ORDER — BENZOCAINE-MENTHOL 20-0.5 % EX AERO
1.0000 "application " | INHALATION_SPRAY | CUTANEOUS | Status: DC | PRN
  Administered 2020-05-30: 1 via TOPICAL
  Filled 2020-05-30: qty 56

## 2020-05-30 MED ORDER — CARBOPROST TROMETHAMINE 250 MCG/ML IM SOLN
INTRAMUSCULAR | Status: AC
  Administered 2020-05-30: 250 ug
  Filled 2020-05-30: qty 1

## 2020-05-30 MED ORDER — SODIUM CHLORIDE 0.9% IV SOLUTION: Freq: Once | INTRAVENOUS | Status: DC

## 2020-05-30 MED ORDER — DIPHENOXYLATE-ATROPINE 2.5-0.025 MG PO TABS
1.0000 | ORAL_TABLET | Freq: Once | ORAL | Status: AC
  Administered 2020-05-30: 1 via ORAL
  Filled 2020-05-30: qty 1

## 2020-05-30 MED ORDER — OXYCODONE HCL 5 MG PO TABS: 10.0000 mg | ORAL_TABLET | ORAL | Status: DC | PRN

## 2020-05-30 MED ORDER — DIPHENHYDRAMINE HCL 25 MG PO CAPS: 25.0000 mg | ORAL_CAPSULE | Freq: Four times a day (QID) | ORAL | Status: DC | PRN

## 2020-05-30 MED ORDER — METHYLERGONOVINE MALEATE 0.2 MG/ML IJ SOLN
0.2000 mg | Freq: Once | INTRAMUSCULAR | Status: AC
  Administered 2020-05-30: 0.2 mg via INTRAMUSCULAR

## 2020-05-30 MED ORDER — TETANUS-DIPHTH-ACELL PERTUSSIS 5-2.5-18.5 LF-MCG/0.5 IM SUSP: 0.5000 mL | Freq: Once | INTRAMUSCULAR | Status: DC

## 2020-05-30 MED ORDER — DIPHENOXYLATE-ATROPINE 2.5-0.025 MG PO TABS: 1.0000 | ORAL_TABLET | Freq: Once | ORAL | Status: DC

## 2020-05-30 MED ORDER — ZOLPIDEM TARTRATE 5 MG PO TABS: 5.0000 mg | ORAL_TABLET | Freq: Every evening | ORAL | Status: DC | PRN

## 2020-05-30 MED ORDER — ONDANSETRON HCL 4 MG PO TABS: 4.0000 mg | ORAL_TABLET | ORAL | Status: DC | PRN

## 2020-05-30 MED ORDER — DIBUCAINE (PERIANAL) 1 % EX OINT: 1.0000 "application " | TOPICAL_OINTMENT | CUTANEOUS | Status: DC | PRN

## 2020-05-30 MED ORDER — METHYLERGONOVINE MALEATE 0.2 MG/ML IJ SOLN: 0.2000 mg | INTRAMUSCULAR | Status: DC | PRN

## 2020-05-30 NOTE — Lactation Note (Addendum)
This note was copied from a baby's chart. Lactation Consultation Note  Patient Name: Linda Guerra Date: 05/30/2020 First time mom, infant early term infant, follow up appointment, mom with PPH. P1, 10 hour ETI female infant . Concerns: P43 mom with PPH, infant with low CBC, and not atching well at breast. Tools given: DEBP to help establish mom's milk supply. Supplement : donor breast milk after each feeding.  Per parents infant had two stools since birth. Per mom, she made two attempts to latch infant at breast, infant was given formula twice due to low CBC and being jittery. Per mom, she  prefers to use donor breast milk instead was not aware donor breast milk was a  supplemental choice, as LC was in room, RN brought parents the donor breast milk. LC discussed hand expression and mom taught back, expressing 55ml of colostrum that was given to infant by spoon, infant started cuing to breastfeed. Mom latched infant in football hold on her left breast, infant did not sustained latch, infant was switched to the cross cradle position, would suckle 5-6 times then come off breast, mom talk with infant and did breast compression, while feeding infant at he breast . Infant  breastfeed for 15 minutes, but was on and off breast. Mom will continue to work on latching infant at breast, mom knows to call RN or Appling for assistance with latching infant at breast if needed. Infant was given 5 mls of donor breast milk by spoon, dad help assist mom with feeding infant. Dad was doing STS with infant as [place] left the room. Mom will breastfeed infant according to feeding cues, 8 to 12+ times within 24 hours. Mom was using the DEBP, mom understands to pump every 3 hours for 15 minutes on initial setting to help establish her milk supply.  Mom made aware of O/P services, breastfeeding support groups, community resources, and our phone # for post-discharge questions.   Mom 's current feeding plan within first  24 hours: 1. Mom will breastfeed infant according to hunger cues doing STS, BF 8 to 12+ times within 24 hours. 2. Mom will offer infant donor breast milk after each feeding until infant CBC is within normal limits and infant starts latching well at breast.  3. Parents understand how much to offer infant after latching infant at breast and if infant wants more to offer it to infant. 4. To help establish mom's milk supply due to [facility] and infant not latching well at breast, mom will pump every 3 hours for 15 minutes on initial setting with hand expression.  Maternal Data Formula Feeding for Exclusion: No Has patient been taught Hand Expression?: Yes Does the patient have breastfeeding experience prior to this delivery?: No  Feeding Feeding Type: Breast Fed  LATCH Score Latch: Repeated attempts needed to sustain latch, nipple held in mouth throughout feeding, stimulation needed to elicit sucking reflex.  Audible Swallowing: A few with stimulation  Type of Nipple: Everted at rest and after stimulation (Mom has small nipples)  Comfort (Breast/Nipple): Soft / non-tender  Hold (Positioning): Assistance needed to correctly position infant at breast and maintain latch.  LATCH Score: 7  Interventions Interventions: Breast feeding basics reviewed;Assisted with latch;Skin to skin;Breast compression;Adjust position;Support pillows;Position options;Hand express;Breast massage;Expressed milk;DEBP  Lactation Tools Discussed/Used Tools: Pump Breast pump type: Double-Electric Breast Pump WIC Program: No Pump Review: Setup, frequency, and cleaning;Milk Storage Initiated by:: Vicente Serene, IBCLC Date initiated:: 05/30/20   Consult Status Consult Status: Follow-up Date:  05/31/20 Follow-up type: In-patient    Vicente Serene 05/30/2020, 5:00 PM

## 2020-05-30 NOTE — Progress Notes (Signed)
Lab Results  Component Value Date   WBC 15.2 (H) 05/30/2020   HGB 9.2 (L) 05/30/2020   HCT 27.0 (L) 05/30/2020   MCV 88.5 05/30/2020   PLT 234 05/30/2020   BP (!) 123/56 (BP Location: Left Arm)   Pulse 75   Temp 98.5 F (36.9 C) (Oral)   Resp 17   Ht 5\' 5"  (1.651 m)   Wt 78.9 kg   LMP 09/09/2019   SpO2 96%   Breastfeeding Unknown   BMI 28.96 kg/m    TC to RN.  Patient doing well.  Has ambulated to BR without dizziness.  Tolerating PO. VS remain stable.  Will d/c foley and d/c IVF.

## 2020-05-30 NOTE — Lactation Note (Signed)
This note was copied from a baby's chart. Lactation Consultation Note  Patient Name: Girl Hurley Blevins [id number] Date: 05/30/2020   Post partum hemorrhage of 2228 ml and maternal fever pp.   LC in to visit with P77 Mom of ET infant at 72 hours old. Baby's birth weight is 6 lbs 2.2.  PPH and maternal fever pp.  Baby noted to be jittery and CBG was 35.  Baby latched for 3 mins and then bottle fed 9 ml of formula.  Second CBG was 42.  Baby fed at breast for 3 mins before "pushing off" per Mom.  Baby fed 12 ml of formula by bottle.   Mom informed of donor milk availability for supplementation.  Parents had questions about donor milk safety and whether donor breast milk was better for baby.  I gave a brief review of benefits.  Pediatrician in to check baby.  Told Mom LC would be back to assist with positioning and latching baby to the breast.    Broadus John 05/30/2020, 4:11 PM

## 2020-05-30 NOTE — Anesthesia Postprocedure Evaluation (Signed)
Anesthesia Post Note  Patient: Linda Guerra  Procedure(s) Performed: AN AD [place]     Patient location during evaluation: Mother Baby Anesthesia Type: Epidural Level of consciousness: awake and alert Pain management: pain level controlled Vital Signs Assessment: post-procedure vital signs reviewed and stable Respiratory status: spontaneous breathing, nonlabored ventilation and respiratory function stable Cardiovascular status: stable Postop Assessment: no headache, no backache and epidural receding Anesthetic complications: no   No complications documented.  Last Vitals:  Vitals:   05/30/20 0940 05/30/20 1202  BP: 110/69 (!) 123/56  Pulse: 79 75  Resp: 16 17  Temp: 36.9 C   SpO2: 96% 96%    Last Pain:  Vitals:   05/30/20 1202  TempSrc:   PainSc: 0-No pain   Pain Goal: Patients Stated Pain Goal: 3 (05/30/20 0855)                 Rayvon Char

## 2020-05-30 NOTE — Plan of Care (Signed)
  Problem: Education: Goal: Knowledge of General Education information will improve Description: Including pain rating scale, medication(s)/side effects and non-pharmacologic comfort measures Outcome: Completed/Met   Problem: Education: Goal: Knowledge of Childbirth will improve Outcome: Completed/Met Goal: Ability to make informed decisions regarding treatment and plan of care will improve Outcome: Completed/Met Goal: Ability to state and carry out methods to decrease the pain will improve Outcome: Completed/Met Goal: Individualized Educational Video(s) Outcome: Completed/Met   Problem: Coping: Goal: Ability to verbalize concerns and feelings about labor and delivery will improve Outcome: Completed/Met   Problem: Life Cycle: Goal: Ability to make normal progression through stages of labor will improve Outcome: Completed/Met Goal: Ability to effectively push during vaginal delivery will improve Outcome: Completed/Met   Problem: Role Relationship: Goal: Will demonstrate positive interactions with the child Outcome: Completed/Met   Problem: Safety: Goal: Risk of complications during labor and delivery will decrease Outcome: Completed/Met   Problem: Pain Management: Goal: Relief or control of pain from uterine contractions will improve Outcome: Completed/Met   

## 2020-05-31 LAB — CBC
HCT: 23.8 % — ABNORMAL LOW (ref 36.0–46.0)
Hemoglobin: 7.8 g/dL — ABNORMAL LOW (ref 12.0–15.0)
MCH: 29.4 pg (ref 26.0–34.0)
MCHC: 32.8 g/dL (ref 30.0–36.0)
MCV: 89.8 fL (ref 80.0–100.0)
Platelets: 232 10*3/uL (ref 150–400)
RBC: 2.65 MIL/uL — ABNORMAL LOW (ref 3.87–5.11)
RDW: 13.6 % (ref 11.5–15.5)
WBC: 12.6 10*3/uL — ABNORMAL HIGH (ref 4.0–10.5)
nRBC: 0 % (ref 0.0–0.2)

## 2020-05-31 NOTE — Progress Notes (Signed)
Patient is doing well.  She is ambulating without difficulty, voiding, tolerating PO.  Pain control is good.  Lochia is appropriate  Vitals:   05/30/20 1728 05/30/20 1952 05/30/20 2318 05/31/20 0345  BP: (!) 106/59 (!) 103/49 (!) 94/50 (!) 99/55  Pulse: 85 90 76 73  Resp: 16 16 20 18   Temp: 99.5 F (37.5 C) 99.2 F (37.3 C) 98.2 F (36.8 C) 98.7 F (37.1 C)  TempSrc: Oral  Oral Oral  SpO2: 98% 99% 98% 99%  Weight:      Height:        NAD Fundus firm, large pedunculated fibroid palpable in right abdomen at umbilicus Ext: no edema  Lab Results  Component Value Date   WBC 15.2 (H) 05/30/2020   HGB 9.2 (L) 05/30/2020   HCT 27.0 (L) 05/30/2020   MCV 88.5 05/30/2020   PLT 234 05/30/2020    --/--/O POS Performed at [facility] [address] [place], [place] [address]  (09/24 0630)/[facility]  A/P 34 y.o. G1P1001 PPD#1 s/p SVD, c/b PPH. Routine care.   PPH:  EBL 2200 mL.  Hgb 11.5--> 7.8.  Received 1U PRBC on L&D.  Doing well, asymptomatic.  Will plan to d/c home with PO iron Expect d/c tomorrow.    [place]

## 2020-05-31 NOTE — Lactation Note (Addendum)
This note was copied from a baby's chart. Lactation Consultation Note  Patient Name: Linda Guerra [id number] Date: 05/31/2020   Infant is 37 weeks 47 hours old with a 4% weight loss. Mom states BF is going well and  infant is latching better. She is only concerned she does not have enough colostrum. Mom has pumped 2 x yesterday and has not pumped today. Mom feeling a little anxious and has been focused on feeding the baby.   [place] reviewed with Mom upon d/c DBM will not be available. Mom will work on pumping consistently today q 3hrs for 15 minutes. LC examines Mom's breast and there is no signs of trauma, redness or compression stripes.   Her left nipple more erect than the right and Mom states infant shows preference to that side. [place] showed Mom how to do a nipple role to get the right nipple to be more erect.   LC used heat, breast massage and hand expression but no colostrum noted from either breast. Mom's pump parts were dirty. LC cleaned all parts and left them to air dry. LC assembled manual pump and checked flange size of 27 which fit comfortably. Mom to used manual pump after breakfast.   Mom also expressed concerns on how to burp the infant during feeds. LC demonstrated different methods with Mom.   LC could not observe a latch since infant just fed at 11 am 20 ml of DBM. Mom's last attempt to breastfeed at 9 am but infant crying and frustrated so Mom gave DBM instead. Infant had 3 stools and 3 urine since birth.  Mom has a Medela pump at home.   Plan 1. Feed infant based on cues 8-12x in 24 hour period no more than 3 hours without attempt. Mom to offer breast first and then supplement with DBM following the late preterm guidelines given to her based on hours after birth. Mom know she can increase volumes as tolerated during feeding.          2. Mom to pump q 3hrs for 15 minutes.          3. Mom to call for assistance with the next latch either RN or LC.

## 2020-05-31 NOTE — Plan of Care (Signed)
  Problem: Role Relationship: Goal: Ability to demonstrate positive interaction with newborn will improve Outcome: Completed/Met

## 2020-06-01 LAB — TYPE AND SCREEN
ABO/RH(D): O POS
Antibody Screen: NEGATIVE
Unit division: 0
Unit division: 0
Unit division: 0
Unit division: 0

## 2020-06-01 LAB — BPAM RBC
Blood Product Expiration Date: 202110232359
Blood Product Expiration Date: 202110252359
Blood Product Expiration Date: 202110252359
Blood Product Expiration Date: 202110252359
ISSUE DATE / TIME: 202109240621
ISSUE DATE / TIME: 202109240621
ISSUE DATE / TIME: 202109240646
Unit Type and Rh: 5100
Unit Type and Rh: 5100
Unit Type and Rh: 5100
Unit Type and Rh: 5100

## 2020-06-01 NOTE — Progress Notes (Signed)
Patient is doing well.  She is ambulating without difficulty, voiding, tolerating PO.  Pain control is good.  Lochia is appropriate  Vitals:   05/31/20 1916 06/01/20 0005 06/01/20 0522 06/01/20 0724  BP: 108/68 103/66 107/67 108/65  Pulse: 86 89 83 72  Resp: 18 18 18 17   Temp: 98 F (36.7 C) 97.6 F (36.4 C) 98.7 F (37.1 C) 98.5 F (36.9 C)  TempSrc: Oral Oral Oral Oral  SpO2: 99% 100% 100% 95%  Weight:      Height:        NAD Fundus firm, large pedunculated fibroid palpable in right abdomen at umbilicus Ext: no edema  Lab Results  Component Value Date   WBC 12.6 (H) 05/31/2020   HGB 7.8 (L) 05/31/2020   HCT 23.8 (L) 05/31/2020   MCV 89.8 05/31/2020   PLT 232 05/31/2020    --/--/O POS Performed at [facility] [address], [place], [place] [address]  (09/24 0630)/[employer]Immune  A/P 34 y.o. G1P1001 PPD# 2s/p SVD, c/b PPH. Routine care.   PPH:  EBL 2200 mL.  Hgb 11.5--> 7.8.  Received 1U PRBC on L&D.  Doing well, asymptomatic.  Will plan to d/c home with PO iron Meeting all goals.  Discharge to home today.  [place]

## 2020-06-01 NOTE — Discharge Summary (Signed)
Postpartum Discharge Summary  Date of Service updated 06/01/20     Patient Name: Linda Guerra DOB: 12/28/1985 MRN: [id number]  Date of admission: 05/28/2020 Delivery date:05/30/2020  Delivering provider: Vanessa Kick  Date of discharge: 06/01/2020  Admitting diagnosis: Full-term premature rupture of membranes [O42.92] Spontaneous vaginal delivery [O80] Intrauterine pregnancy: [redacted]w[redacted]d     Secondary diagnosis:  Active Problems:   Full-term premature rupture of membranes   Spontaneous vaginal delivery  Additional problems: Uterine fibroids    Discharge diagnosis: Term Pregnancy Delivered and PPH                                              Post partum procedures:blood transfusion Augmentation: Pitocin Complications: [id number]  Hospital course: Induction of Labor With Vaginal Delivery   34 y.o. yo G1P1001 at [redacted]w[redacted]d was admitted to the hospital 05/28/2020 for induction of labor.  Indication for induction: PROM.  Patient had prolonged latent phase.  After delivery, she had a PPH with EBL 2.2L requiring cytotec, methergine, hemabate, TXA.  She received 1U PRBCs.  Membrane Rupture Time/Date: 2:45 PM ,05/28/2020   Delivery Method:Vaginal, Spontaneous  Episiotomy: None  Lacerations:  None  Details of delivery can be found in separate delivery note.  Following transfusion, she did well and had a routine postpartum course. Patient is discharged home 06/01/20.  Newborn Data: Birth date:05/30/2020  Birth time:6:03 AM  Gender:Female  Living status:Living  Apgars:8 ,9  Weight:2785 g   Transfusion:Yes  Physical exam  Vitals:   05/31/20 1916 06/01/20 0005 06/01/20 0522 06/01/20 0724  BP: 108/68 103/66 107/67 108/65  Pulse: 86 89 83 72  Resp: 18 18 18 17   Temp: 98 F (36.7 C) 97.6 F (36.4 C) 98.7 F (37.1 C) 98.5 F (36.9 C)  TempSrc: Oral Oral Oral Oral  SpO2: 99% 100% 100% 95%  Weight:      Height:       General: alert, cooperative and no distress Lochia:  appropriate Uterine Fundus: firm DVT Evaluation: No evidence of DVT seen on physical exam. Labs: Lab Results  Component Value Date   WBC 12.6 (H) 05/31/2020   HGB 7.8 (L) 05/31/2020   HCT 23.8 (L) 05/31/2020   MCV 89.8 05/31/2020   PLT 232 05/31/2020   CMP Latest Ref Rng & Units 12/03/2019  Glucose 70 - 99 mg/dL 114(H)  BUN 6 - 20 mg/dL 13  Creatinine 0.44 - 1.00 mg/dL 0.47  Sodium 135 - 145 mmol/L 137  Potassium 3.5 - 5.1 mmol/L 3.9  Chloride 98 - 111 mmol/L 105  CO2 22 - 32 mmol/L 23  Calcium 8.9 - 10.3 mg/dL 9.6  Total Protein 6.5 - 8.1 g/dL 7.8  Total Bilirubin 0.3 - 1.2 mg/dL 0.3  Alkaline Phos 38 - 126 U/L 56  AST 15 - 41 U/L 22  ALT 0 - 44 U/L 28   Edinburgh Score: No flowsheet data found.    After visit meds:  Allergies as of 06/01/2020   No Known Allergies     Medication List    STOP taking these medications   guaiFENesin 100 MG/5ML liquid Commonly known as: ROBITUSSIN     TAKE these medications   acetaminophen 325 MG tablet Commonly known as: TYLENOL Take 650 mg by mouth every 6 (six) hours as needed for mild pain or headache.   multivitamin-prenatal 27-0.8 MG Tabs tablet Take 1  tablet by mouth daily.   sodium chloride 0.65 % Soln nasal spray Commonly known as: OCEAN Place 1 spray into both nostrils as needed for congestion.        Discharge home in stable condition Infant Feeding: Breast Infant Disposition:home with mother Discharge instruction: per After Visit Summary and Postpartum booklet. Activity: Advance as tolerated. Pelvic rest for 6 weeks.  Diet: routine diet Postpartum Appointment:4 weeks Future Appointments:No future appointments. Follow up Visit:  Follow-up Information    Vanessa Kick, MD Follow up in 4 week(s).   Specialty: Obstetrics and Gynecology Contact information: [place] [address] [phone]                   06/01/2020 [facility] Lars Masson, MD

## 2020-06-01 NOTE — Discharge Instructions (Signed)

## 2020-06-01 NOTE — Lactation Note (Signed)
This note was copied from a baby's chart. Lactation Consultation Note  Patient Name: Linda Guerra [id number] Date: 06/01/2020  [name] Linda Guerra now 47 hours old.  STS with dad on arrival. Mom with post partum hemorrage and received blood transfusion. Mom reports she ius getting better with breastfeeding.  Mom, reports she tries to breastfed her each time before she gets a bottle.  Mom reports she has not been consistently pumping. Mom reports breast growth and tenderness during pregnancy.  Mom reports breasts feeling heavier.Mom denies breast or nipple pain or soreness.  Mom has a Medela pump n style for home use that was given to her by a friend. Urged mom to take home all of the tubing and flanges for use with the pump so she would have her own tubing, Flanges,etc. Showed her how t modify tubes to make it work for her pump.  Mom also has private insurance.  Mom has not contacted private insurance company about a DEBP.  Urged mom to do so.  Reviewed flange fit, cleaning pump parts,saniotizinfg pump parts.  Discussed how mom really needs to be more consistent with pumping and pump at least 8 times day.  Urged to try and pump past breastfeedings. Mom has breastfeeding resource pamphlet for home use.  Urged mom to call lactation as needed.    Consult Status      Linda Guerra 06/01/2020, 1:01 PM

## 2020-06-02 LAB — SURGICAL PATHOLOGY

## 2020-06-09 ENCOUNTER — Inpatient Hospital Stay (HOSPITAL_COMMUNITY): Admit: 2020-06-09 | Payer: Self-pay | Admitting: Obstetrics

## 2020-06-09 ENCOUNTER — Encounter (HOSPITAL_COMMUNITY): Payer: Self-pay

## 2020-06-09 SURGERY — Surgical Case
Anesthesia: Regional

## 2020-12-24 ENCOUNTER — Ambulatory Visit: Payer: Self-pay | Admitting: Family Medicine

## 2020-12-24 DIAGNOSIS — Z0289 Encounter for other administrative examinations: Secondary | ICD-10-CM

## 2020-12-31 ENCOUNTER — Ambulatory Visit (INDEPENDENT_AMBULATORY_CARE_PROVIDER_SITE_OTHER): Payer: 59 | Admitting: Pulmonary Disease

## 2020-12-31 ENCOUNTER — Other Ambulatory Visit: Payer: Self-pay

## 2020-12-31 ENCOUNTER — Encounter: Payer: Self-pay | Admitting: Pulmonary Disease

## 2020-12-31 ENCOUNTER — Ambulatory Visit (INDEPENDENT_AMBULATORY_CARE_PROVIDER_SITE_OTHER): Payer: 59

## 2020-12-31 VITALS — BP 110/70 | HR 86 | Temp 98.0°F | Ht 65.5 in | Wt 177.8 lb

## 2020-12-31 DIAGNOSIS — R053 Chronic cough: Secondary | ICD-10-CM

## 2020-12-31 DIAGNOSIS — U099 Post covid-19 condition, unspecified: Secondary | ICD-10-CM

## 2020-12-31 MED ORDER — HYDROCODONE-HOMATROPINE 5-1.5 MG/5ML PO SYRP: 5.0000 mL | ORAL_SOLUTION | Freq: Four times a day (QID) | ORAL | 0 refills | Status: DC | PRN

## 2020-12-31 MED ORDER — IPRATROPIUM BROMIDE 0.06 % NA SOLN: 2.0000 | Freq: Four times a day (QID) | NASAL | 12 refills | Status: DC

## 2020-12-31 NOTE — Progress Notes (Signed)
Subjective:   PATIENT ID: Linda Guerra GENDER: female DOB: [birth year], MRN: [id number]   HPI  Chief Complaint  Patient presents with  . Consult    Cough since January of 2021, since she had covid.  Dry hacky cough.  Feels like she has an itchy sensation in her throat and makes her cough.  Increased nasal congestion.  Takes sinex nasal spray, helps with congestion.  Asthma as a child.    Reason for Visit: New consult for chronic cough  Linda Guerra is a 35 year old female never smoker with history of childhood asthma, prior history of COVID-19 in 09/2019 who presents as new consult.  When she had COVID-19 she was ill for over a month and was seen in the ED x2 with normal CXR and oxygen levels. Her body aches and lost of taste recovered however she has had persistent unproductive cough. She has a daily cough and throat clearing that occurs during the day. Denies nocturnal cough. She feels like she has throat/chest irritation. Associated with shortness of breath with coughing fits. Her cough has only been productive when she had a cold. Has tried mucinex and robitussin was previously helpful but now is no longer effective. When she was pregnant she did take a low dose of prednisone during pregnancy for seven but had no effect. She has tried [employer] with some help. Cough is aggravated by dust at work, pollen. She reports nasal congestion and postnasal drainage. Currently using sinex spray twice a day. Did not feel flonase worked well. Denies reflux but has stopped dairy that she felt increased the sensation of mucous production in her throat. She is able to work out daily on her bike. She has had limited her social life.  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  Past Medical  History:  Diagnosis Date  . COVID-19   . Uterine fibroid      Family History  Problem Relation Age of Onset  . Healthy Mother   . Healthy Father      Social History   Occupational History  . Occupation: customer service  Tobacco Use  . Smoking status: Never Smoker  . Smokeless tobacco: Never Used  Vaping Use  . Vaping Use: Never used  Substance and Sexual Activity  . Alcohol use: Not Currently  . Drug use: Not Currently  . Sexual activity: Yes    Birth control/protection: None    No Known Allergies   Outpatient Medications Prior to Visit  Medication Sig Dispense Refill  . acetaminophen (TYLENOL) 325 MG tablet Take 650 mg by mouth every 6 (six) hours as needed for mild pain or headache.    . Prenatal Vit-Fe Fumarate-FA (MULTIVITAMIN-PRENATAL) 27-0.8 MG TABS tablet Take 1 tablet by mouth daily.     . sodium chloride (OCEAN) 0.65 % SOLN nasal spray Place 1 spray into both nostrils as needed for congestion.     No facility-administered medications prior to visit.    Review of Systems  Constitutional: Negative for chills, diaphoresis, fever, malaise/fatigue and weight loss.  HENT: Positive for congestion. Negative for ear pain and sore throat.   Respiratory: Positive for cough and shortness of breath. Negative for hemoptysis, sputum production and wheezing.   Cardiovascular: Negative for chest pain, palpitations and leg swelling.  Gastrointestinal: Negative for abdominal  pain, heartburn and nausea.  Genitourinary: Negative for frequency.  Musculoskeletal: Negative for joint pain and myalgias.  Skin: Negative for itching and rash.  Neurological: Negative for dizziness, weakness and headaches.  Endo/Heme/Allergies: Positive for environmental allergies. Does not bruise/bleed easily.  Psychiatric/Behavioral: Negative for depression. The patient is not nervous/anxious.      Objective:   Vitals:   12/31/20 0903  BP: 110/70  Pulse: 86  Temp: 98 F (36.7 C)  TempSrc:  Temporal  SpO2: 99%  Weight: 177 lb 12.8 oz (80.6 kg)  Height: 5' 5.5" (1.664 m)      Physical Exam: General: Well-appearing, no acute distress HENT: [place], AT Eyes: EOMI, no scleral icterus Respiratory: Clear to auscultation bilaterally.  No crackles, wheezing or rales Cardiovascular: RRR, -M/R/G, no JVD Extremities:-Edema,-tenderness Neuro: AAO x4, CNII-XII grossly intact Skin: Intact, no rashes or bruising Psych: Normal mood, normal affect  Data Reviewed:  Imaging: CT A/P Lung fields 07/10/19 - Normal parenchyma in the lung bases. No reticulation, scarring or atelectasis noted. CXR 10/13/19 - No airspace disease, edema or effusion  PFT: None on file  Labs: 05/29/20 Hg 7.8 in setting of post-partum hemorrhage  Imaging, labs and test noted above have been reviewed independently by me.    Assessment & Plan:   Discussion: 35 year old female with allergic rhinitis, hx of childhood asthma and prior COVID-19 infection in 09/2019 who presents with chronic cough x 13 months. Discussed common causes of cough including UACS, asthma and reflux. If pulmonary work-up is negative, can consider CT Chest and bronchoscopy if clinically indicated.  Chronic cough --ORDER CXR --ORDER pulmonary function tests  Allergic Rhinitis --START Zyrtec once a day --START Atrovent nasal spray 1-2 sprays twice a day  Health Maintenance Immunization History  Administered Date(s) Administered  . PFIZER(Purple Top)SARS-COV-2 Vaccination 12/26/2019, 01/21/2020   CT Lung Screen - no tobacco history  Orders Placed This Encounter  Procedures  . DG Chest 2 View    Standing Status:   Future    Number of Occurrences:   1    Standing Expiration Date:   12/31/2021    Order Specific Question:   Reason for Exam (SYMPTOM  OR DIAGNOSIS REQUIRED)    Answer:   chronic cough    Order Specific Question:   Preferred imaging location?    Answer:   Internal    Order Specific Question:   Radiology Contrast Protocol -  do NOT remove file path    Answer:   [email or web address]  . Pulmonary Function Test    Standing Status:   Future    Standing Expiration Date:   12/31/2021    Order Specific Question:   Where should this test be performed?    Answer:   [facility]    Order Specific Question:   Full PFT: includes the following: basic spirometry, spirometry pre & post bronchodilator, diffusion capacity (DLCO), lung volumes    Answer:   Full PFT    Order Specific Question:   MIP/MEP    Answer:   No    Order Specific Question:   6 minute walk    Answer:   No    Order Specific Question:   ABG    Answer:   No    Order Specific Question:   Diffusion capacity (DLCO)    Answer:   Yes    Order Specific Question:   Lung volumes    Answer:   Yes    Order Specific Question:   Methacholine challenge  Answer:   No   Meds ordered this encounter  Medications  . ipratropium (ATROVENT) 0.06 % nasal spray    Sig: Place 2 sprays into both nostrils 4 (four) times daily.    Dispense:  15 mL    Refill:  12  . HYDROcodone-homatropine (HYCODAN) 5-1.5 MG/5ML syrup    Sig: Take 5 mLs by mouth every 6 (six) hours as needed for cough.    Dispense:  240 mL    Refill:  0    Return in about 27 days (around 01/27/2021).  I have spent a total time of 45-minutes on the day of the appointment reviewing prior documentation, coordinating care and discussing medical diagnosis and plan with the patient/family. Imaging, labs and tests included in this note have been reviewed and interpreted independently by me.  [place], MD [facility] 12/31/2020 8:51 AM  Office Number [phone]

## 2020-12-31 NOTE — Patient Instructions (Signed)
  Chronic cough --ORDER CXR --ORDER pulmonary function tests  Allergic Rhinitis --START Zyrtec once a day --START Atrovent nasal spray 1-2 sprays twice a day  Follow-up with me after PFTs

## 2021-01-23 ENCOUNTER — Other Ambulatory Visit (HOSPITAL_COMMUNITY)
Admission: RE | Admit: 2021-01-23 | Discharge: 2021-01-23 | Disposition: A | Discharge status: Home / Self Care | Payer: 59 | Source: Ambulatory Visit | Attending: Pulmonary Disease | Admitting: Pulmonary Disease

## 2021-01-23 DIAGNOSIS — Z20822 Contact with and (suspected) exposure to covid-19: Secondary | ICD-10-CM | POA: Insufficient documentation

## 2021-01-23 DIAGNOSIS — Z01812 Encounter for preprocedural laboratory examination: Secondary | ICD-10-CM | POA: Diagnosis not present

## 2021-01-23 LAB — SARS CORONAVIRUS 2 (TAT 6-24 HRS): SARS Coronavirus 2: NEGATIVE

## 2021-01-27 ENCOUNTER — Encounter: Payer: Self-pay | Admitting: Pulmonary Disease

## 2021-01-27 ENCOUNTER — Ambulatory Visit: Payer: 59 | Admitting: Pulmonary Disease

## 2021-01-27 ENCOUNTER — Other Ambulatory Visit: Payer: Self-pay

## 2021-01-27 VITALS — BP 116/72 | HR 88 | Temp 98.2°F | Ht 64.0 in | Wt 174.0 lb

## 2021-01-27 DIAGNOSIS — J4530 Mild persistent asthma, uncomplicated: Secondary | ICD-10-CM

## 2021-01-27 DIAGNOSIS — J301 Allergic rhinitis due to pollen: Secondary | ICD-10-CM | POA: Diagnosis not present

## 2021-01-27 DIAGNOSIS — R053 Chronic cough: Secondary | ICD-10-CM

## 2021-01-27 DIAGNOSIS — J42 Unspecified chronic bronchitis: Secondary | ICD-10-CM

## 2021-01-27 LAB — PULMONARY FUNCTION TEST
DL/VA % pred: 154 %
DL/VA: 7 ml/min/mmHg/L
DLCO cor % pred: 137 %
DLCO cor: 30.69 ml/min/mmHg
DLCO unc % pred: 137 %
DLCO unc: 30.69 ml/min/mmHg
FEF 25-75 Post: 3.42 L/sec
FEF 25-75 Pre: 3 L/sec
FEF2575-%Change-Post: 14 %
FEF2575-%Pred-Post: 102 %
FEF2575-%Pred-Pre: 89 %
FEV1-%Change-Post: 2 %
FEV1-%Pred-Post: 89 %
FEV1-%Pred-Pre: 87 %
FEV1-Post: 2.79 L
FEV1-Pre: 2.72 L
FEV1FVC-%Change-Post: 2 %
FEV1FVC-%Pred-Pre: 101 %
FEV6-%Change-Post: 0 %
FEV6-%Pred-Post: 86 %
FEV6-%Pred-Pre: 86 %
FEV6-Post: 3.21 L
FEV6-Pre: 3.2 L
FEV6FVC-%Change-Post: 0 %
FEV6FVC-%Pred-Post: 101 %
FEV6FVC-%Pred-Pre: 101 %
FVC-%Change-Post: 0 %
FVC-%Pred-Post: 85 %
FVC-%Pred-Pre: 85 %
FVC-Post: 3.21 L
FVC-Pre: 3.2 L
Post FEV1/FVC ratio: 87 %
Post FEV6/FVC ratio: 100 %
Pre FEV1/FVC ratio: 85 %
Pre FEV6/FVC Ratio: 100 %
RV % pred: 108 %
RV: 1.58 L
TLC % pred: 94 %
TLC: 4.75 L

## 2021-01-27 MED ORDER — HYDROCODONE BIT-HOMATROP MBR 5-1.5 MG/5ML PO SOLN: 5.0000 mL | Freq: Four times a day (QID) | ORAL | 0 refills | Status: DC | PRN

## 2021-01-27 MED ORDER — MONTELUKAST SODIUM 10 MG PO TABS: 10.0000 mg | ORAL_TABLET | Freq: Every day | ORAL | 11 refills | Status: DC

## 2021-01-27 MED ORDER — BREO ELLIPTA 100-25 MCG/INH IN AEPB: 1.0000 | INHALATION_SPRAY | Freq: Every day | RESPIRATORY_TRACT | 3 refills | Status: AC

## 2021-01-27 NOTE — Progress Notes (Signed)
Subjective:   PATIENT ID: Linda Guerra GENDER: female DOB: 06-07-1986, MRN: [id number]   HPI  Chief Complaint  Patient presents with   Follow-up    Still having congestion and sore throat. Cough has improved. Review PFT results.    Reason for Visit: Follow-up chronic cough  [name] is a 35 year old female never smoker with history of childhood asthma, prior history of COVID-19 in 09/2019 who presents for follow-up.  Synopsis: When she had COVID-19 she was ill for over a month and was seen in the ED x2 with normal CXR and oxygen levels. Her body aches and lost of taste recovered however she has had persistent unproductive cough. She has a daily cough and throat clearing that occurs during the day. Denies nocturnal cough. She feels like she has throat/chest irritation. Associated with shortness of breath with coughing fits. Her cough has only been productive when she had a cold. Has tried mucinex and robitussin was previously helpful but now is no longer effective. When she was pregnant she did take a low dose of prednisone during pregnancy for seven but had no effect. She has tried [employer] with some help. Cough is aggravated by dust at work, pollen. She reports nasal congestion and postnasal drainage. Currently using sinex spray twice a day. Did not feel flonase worked well. Denies reflux but has stopped dairy that she felt increased the sensation of mucous production in her throat. She is able to work out daily on her bike. She has had limited her social life.  Since our last visit, she was unable to tolerate nasal spray. The cough syrup helped suppressed her cough, using it twice a day. Otherwise, cough remains unchanged. Activity unchanged and remains limited.  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  Past Medical History:  Diagnosis Date   Asthma    COVID-19    Uterine fibroid     No Known Allergies   Outpatient Medications Prior to Visit  Medication Sig Dispense Refill   HYDROcodone-homatropine (HYCODAN) 5-1.5 MG/5ML syrup Take 5 mLs by mouth every 6 (six) hours as needed for cough. (Patient not taking: Reported on 01/27/2021) 240 mL 0   ipratropium (ATROVENT) 0.06 % nasal spray Place 2 sprays into both nostrils 4 (four) times daily. (Patient not taking: Reported on 01/27/2021) 15 mL 12   OVER THE COUNTER MEDICATION Sinex severe 12 hour, 3 sprays each nostril. (Patient not taking: Reported on 01/27/2021)     No facility-administered medications prior to visit.    Review of Systems  Constitutional:  Negative for chills, diaphoresis, fever, malaise/fatigue and weight loss.  HENT:  Positive for congestion.   Respiratory:  Positive for cough and shortness of breath. Negative for hemoptysis, sputum production and wheezing.   Cardiovascular:  Negative for chest pain, palpitations and leg swelling.    Objective:   Vitals:   01/27/21 1354  BP: 116/72  Pulse: 88  Temp: 98.2 F (36.8 C)  TempSrc: Temporal  SpO2: 98%  Weight: 174 lb (78.9 kg)  Height: 5\' 4"  (1.626 m)   SpO2: 98 % O2 Device: None (Room air)  Physical Exam: General: Well-appearing, no acute distress HENT: [place], AT Eyes: EOMI, no scleral icterus Respiratory: Clear to auscultation bilaterally.  No crackles, wheezing or rales Cardiovascular: RRR, -M/R/G, no JVD Extremities:-Edema,-tenderness Neuro:  AAO x4, CNII-XII grossly intact Skin: Intact, no rashes or bruising Psych: Normal mood, normal affect  Data Reviewed:  Imaging: CT A/P Lung fields 07/10/19 - Normal parenchyma in the lung bases. No reticulation, scarring or atelectasis noted. CXR 10/13/19 - No airspace disease, edema or effusion CXR 12/31/20 - No infiltate, effusion or edema  PFT: 01/27/21 FVC 3.21 (85%) FEV1  2.79 (89%) Ratio 85  TLC 94% DLCO 137% Interpretation: Normal spirometry. Increased DLCO. No significant bronchodilator response however does not preclude benefit on inhaler therapy. F-V suggests minimal obstructive defect presents.  Labs: 05/29/20 Hg 7.8 in setting of post-partum hemorrhage  Imaging, labs and test noted above have been reviewed independently by me.    Assessment & Plan:   Discussion: 35 year old female with asthma, allergic rhinitis and prior COVID-19 infection in 09/2019 who presents for follow-up. Reviewed PFTs.   Mild persistent asthma Chronic bronchitis  Allergic rhinitis --START Breo 100-25 mcg ONE puff daily --START singulair 10 mg daily --CONTINUE Zyrtec once a day --CONTINUE sinex as needed  Health Maintenance Immunization History  Administered Date(s) Administered   PFIZER Comirnaty(Gray Top)Covid-19 Tri-Sucrose Vaccine 07/27/2020   PFIZER(Purple Top)SARS-COV-2 Vaccination 12/26/2019, 01/21/2020   CT Lung Screen - no tobacco history  No orders of the defined types were placed in this encounter.  Meds ordered this encounter  Medications   fluticasone furoate-vilanterol (BREO ELLIPTA) 100-25 MCG/INH AEPB    Sig: Inhale 1 puff into the lungs daily for 1 dose.    Dispense:  60 each    Refill:  3   montelukast (SINGULAIR) 10 MG tablet    Sig: Take 1 tablet (10 mg total) by mouth at bedtime.    Dispense:  30 tablet    Refill:  11   HYDROcodone bit-homatropine (HYCODAN) 5-1.5 MG/5ML syrup    Sig: Take 5 mLs by mouth every 6 (six) hours as needed for cough.    Dispense:  240 mL    Refill:  0   Return in about 3 months (around 04/29/2021).  I have spent a total time of 31-minutes on the day of the appointment reviewing prior documentation, coordinating care and discussing medical diagnosis and plan with the patient/family. Imaging, labs and tests included in this note have been reviewed and interpreted independently by me.  [place],  MD [facility] 01/27/2021 1:59 PM  Office Number [phone]

## 2021-02-23 DIAGNOSIS — J4530 Mild persistent asthma, uncomplicated: Secondary | ICD-10-CM | POA: Insufficient documentation

## 2021-04-27 ENCOUNTER — Encounter: Payer: Self-pay | Admitting: Pulmonary Disease

## 2021-04-27 ENCOUNTER — Other Ambulatory Visit: Payer: Self-pay | Admitting: Pulmonary Disease

## 2021-04-27 ENCOUNTER — Ambulatory Visit (INDEPENDENT_AMBULATORY_CARE_PROVIDER_SITE_OTHER): Payer: 59 | Admitting: Pulmonary Disease

## 2021-04-27 ENCOUNTER — Other Ambulatory Visit: Payer: Self-pay

## 2021-04-27 VITALS — BP 120/64 | HR 82 | Temp 99.1°F | Ht 64.0 in | Wt 176.0 lb

## 2021-04-27 DIAGNOSIS — J301 Allergic rhinitis due to pollen: Secondary | ICD-10-CM

## 2021-04-27 DIAGNOSIS — J4530 Mild persistent asthma, uncomplicated: Secondary | ICD-10-CM

## 2021-04-27 MED ORDER — FLUTICASONE FUROATE-VILANTEROL 200-25 MCG/INH IN AEPB: 1.0000 | INHALATION_SPRAY | Freq: Every day | RESPIRATORY_TRACT | 6 refills | Status: DC

## 2021-04-27 NOTE — Patient Instructions (Addendum)
Mild persistent asthma - not resolved Chronic bronchitis  Allergic rhinitis --INCREASE Breo 200-25 mcg ONE puff daily --CONTINUE saline rinses and flonase as needed  **If you decide to use the lower dose of Breo, please our office and we will change to Breo 100-25 mcg ONE puff daily**  Follow-up with me in 6 months

## 2021-04-27 NOTE — Progress Notes (Signed)
Subjective:   PATIENT ID: Linda Guerra GENDER: female DOB: [birth year], MRN: [id number]   HPI  Chief Complaint  Patient presents with   Follow-up    Pt states she is pregnant, she states she took one last dose of medication Wednesday and found out she is pregnant Saturday she want to make sure medication doesn't harm pregnancy   Reason for Visit: Follow-up chronic cough  [name] is a 35 year old female never smoker with history of childhood asthma, prior history of COVID-19 in 09/2019 who presents for follow-up.  Synopsis: When she had COVID-19 she was ill for over a month and was seen in the ED x2 with normal CXR and oxygen levels. Her body aches and lost of taste recovered however she has had persistent unproductive cough. She has a daily cough and throat clearing that occurs during the day. Denies nocturnal cough. She feels like she has throat/chest irritation. Associated with shortness of breath with coughing fits. Her cough has only been productive when she had a cold. Has tried mucinex and robitussin was previously helpful but now is no longer effective. When she was pregnant she did take a low dose of prednisone during pregnancy for seven but had no effect. She has tried [employer] with some help. Cough is aggravated by dust at work, pollen. She reports nasal congestion and postnasal drainage. Currently using sinex spray twice a day. Did not feel flonase worked well. Denies reflux but has stopped dairy that she felt increased the sensation of mucous production in her throat. She is able to work out daily on her bike. She has had limited her social life.  12/31/20 Since our last visit, she was unable to tolerate nasal spray. The cough syrup helped suppressed her cough, using it twice a day. Otherwise, cough remains unchanged. Activity unchanged and remains limited.  04/27/21 Since our last visit, PFTs were normal. Trial of Breo and singulair was prescribed. PRN cough syrup.  She reports that after starting her inhaler she has partial benefit with her cough. Shortness of breath is usually associated with cough and this has improved. She feels that her flaring up is not as severe outdoors. Being at work will trigger her symptoms. She did not think singulair after one month so self-discontinued. In the last month she has only taken her cough syrup twice in the last month. She found out she was pregnant three days ago.  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 240 mL 0   HYDROcodone-homatropine (HYCODAN) 5-1.5 MG/5ML syrup Take 5 mLs by mouth every 6 (six) hours as needed for cough. (Patient not taking: Reported on 01/27/2021) 240 mL 0   ipratropium (ATROVENT) 0.06 % nasal spray Place 2 sprays into both nostrils 4 (four) times daily. (Patient not taking: Reported on 01/27/2021) 15 mL 12   montelukast (SINGULAIR) 10 MG tablet Take 1 tablet (10 mg total) by mouth at bedtime. 30 tablet 11   OVER THE COUNTER MEDICATION Sinex severe 12 hour, 3 sprays each nostril. (Patient not taking: Reported  on 01/27/2021)     No facility-administered medications prior to visit.    Review of Systems  Constitutional:  Negative for chills, diaphoresis, fever, malaise/fatigue and weight loss.  HENT:  Negative for congestion.   Respiratory:  Positive for cough and shortness of breath. Negative for hemoptysis, sputum production and wheezing.   Cardiovascular:  Negative for chest pain, palpitations and leg swelling.    Objective:   Vitals:   04/27/21 1339  BP: 120/64  Pulse: 82  Temp: 99.1  F (37.3 C)  TempSrc: Oral  SpO2: 95%  Weight: 176 lb (79.8 kg)  Height: '5\' 4"'$  (1.626 m)      Physical Exam: General: Well-appearing, no acute distress HENT: [place], AT Eyes: EOMI, no scleral icterus Respiratory: Clear to auscultation bilaterally.  No crackles, wheezing or rales Cardiovascular: RRR, -M/R/G, no JVD Extremities:-Edema,-tenderness Neuro: AAO x4, CNII-XII grossly intact Psych: Normal mood, normal affect  Data Reviewed:  Imaging: CT A/P Lung fields 07/10/19 - Normal parenchyma in the lung bases. No reticulation, scarring or atelectasis noted. CXR 10/13/19 - No airspace disease, edema or effusion CXR 12/31/20 - No infiltate, effusion or edema  PFT: 01/27/21 FVC 3.21 (85%) FEV1 2.79 (89%) Ratio 85  TLC 94% DLCO 137% Interpretation: Normal spirometry. Increased DLCO. No significant bronchodilator response however does not preclude benefit on inhaler therapy. F-V suggests minimal obstructive defect presents.  Labs: 05/29/20 Hg 7.8 in setting of post-partum hemorrhage     Assessment & Plan:   Discussion: 35 year old female with asthma, allergic rhinitis and prior COVID-19 infection in 09/2019 who presents for follow-up. Better controlled on Breo however not resolved. Counseled on importance of compliance with bronchodilator therapy and minimizing asthma exacerbations in setting of pregnancy. Addressed questions regarding safety profile of medications are either B/C with the understanding that being off asthma medications risk maternal health. Given her symptoms are mild, she can trial being off medications. After discussion, [name] wished to continue treatment.  Mild persistent asthma - not resolved Chronic bronchitis  Allergic rhinitis --INCREASE Breo 200-25 mcg ONE puff daily --CONTINUE saline rinses and flonase as needed  **If you decide to use the lower dose of Breo, please our office and we will change to Breo 100-25 mcg ONE puff daily**  ADDENDUM 8/24: Insurance  will not cover Breo. Will change to fluticasone-salmeterol ONE puff TWICE a day  Health Maintenance Immunization History  Administered Date(s) Administered   PFIZER Comirnaty(Gray Top)Covid-19 Tri-Sucrose Vaccine 07/27/2020   PFIZER(Purple Top)SARS-COV-2 Vaccination 12/26/2019, 01/21/2020   CT Lung Screen - no tobacco history  No orders of the defined types were placed in this encounter.  Meds ordered this encounter  Medications   fluticasone furoate-vilanterol (BREO ELLIPTA) 200-25 MCG/INH AEPB    Sig: Inhale 1 puff into the lungs daily.    Dispense:  30 each    Refill:  6   Return in about 6 months (around 10/28/2021).  I have spent a total time of 35-minutes on the day of the appointment reviewing prior documentation, coordinating care and discussing medical diagnosis and plan with the patient/family. Past medical history, allergies, medications were reviewed. Pertinent imaging, labs and tests included in this note have been reviewed and interpreted independently by me.   [place], MD [facility] 04/27/2021 1:15 PM  Office Number [phone]

## 2021-04-29 NOTE — Telephone Encounter (Signed)
Please contact patient regarding inhaler change due to insurance. This inhaler is different than her previous Chiropodist).  She will need to take Fluticasone Salmeterol 232-14 mcg ONE puff TWICE a day

## 2021-04-29 NOTE — Telephone Encounter (Signed)
Attempted to contact patient. Her voicemail box is full.

## 2021-05-22 NOTE — Telephone Encounter (Signed)
Called and spoke with Patient. Discussed inhaler change and instructions with Patient.  Patient stated she received inhaler this week and will call for any questions or issues.  Nothing further at this time.

## 2021-06-23 LAB — OB RESULTS CONSOLE GC/CHLAMYDIA
Chlamydia: NEGATIVE
Gonorrhea: NEGATIVE
Neisseria Gonorrhea: NEGATIVE

## 2021-06-30 IMAGING — DX DG CHEST 2V
2 series · 2 of 2 positions shown · non-contrast
Comparison: October 13, 2019

CLINICAL DATA: Chronic cough

EXAM:
CHEST - 2 VIEW

[chest pa]
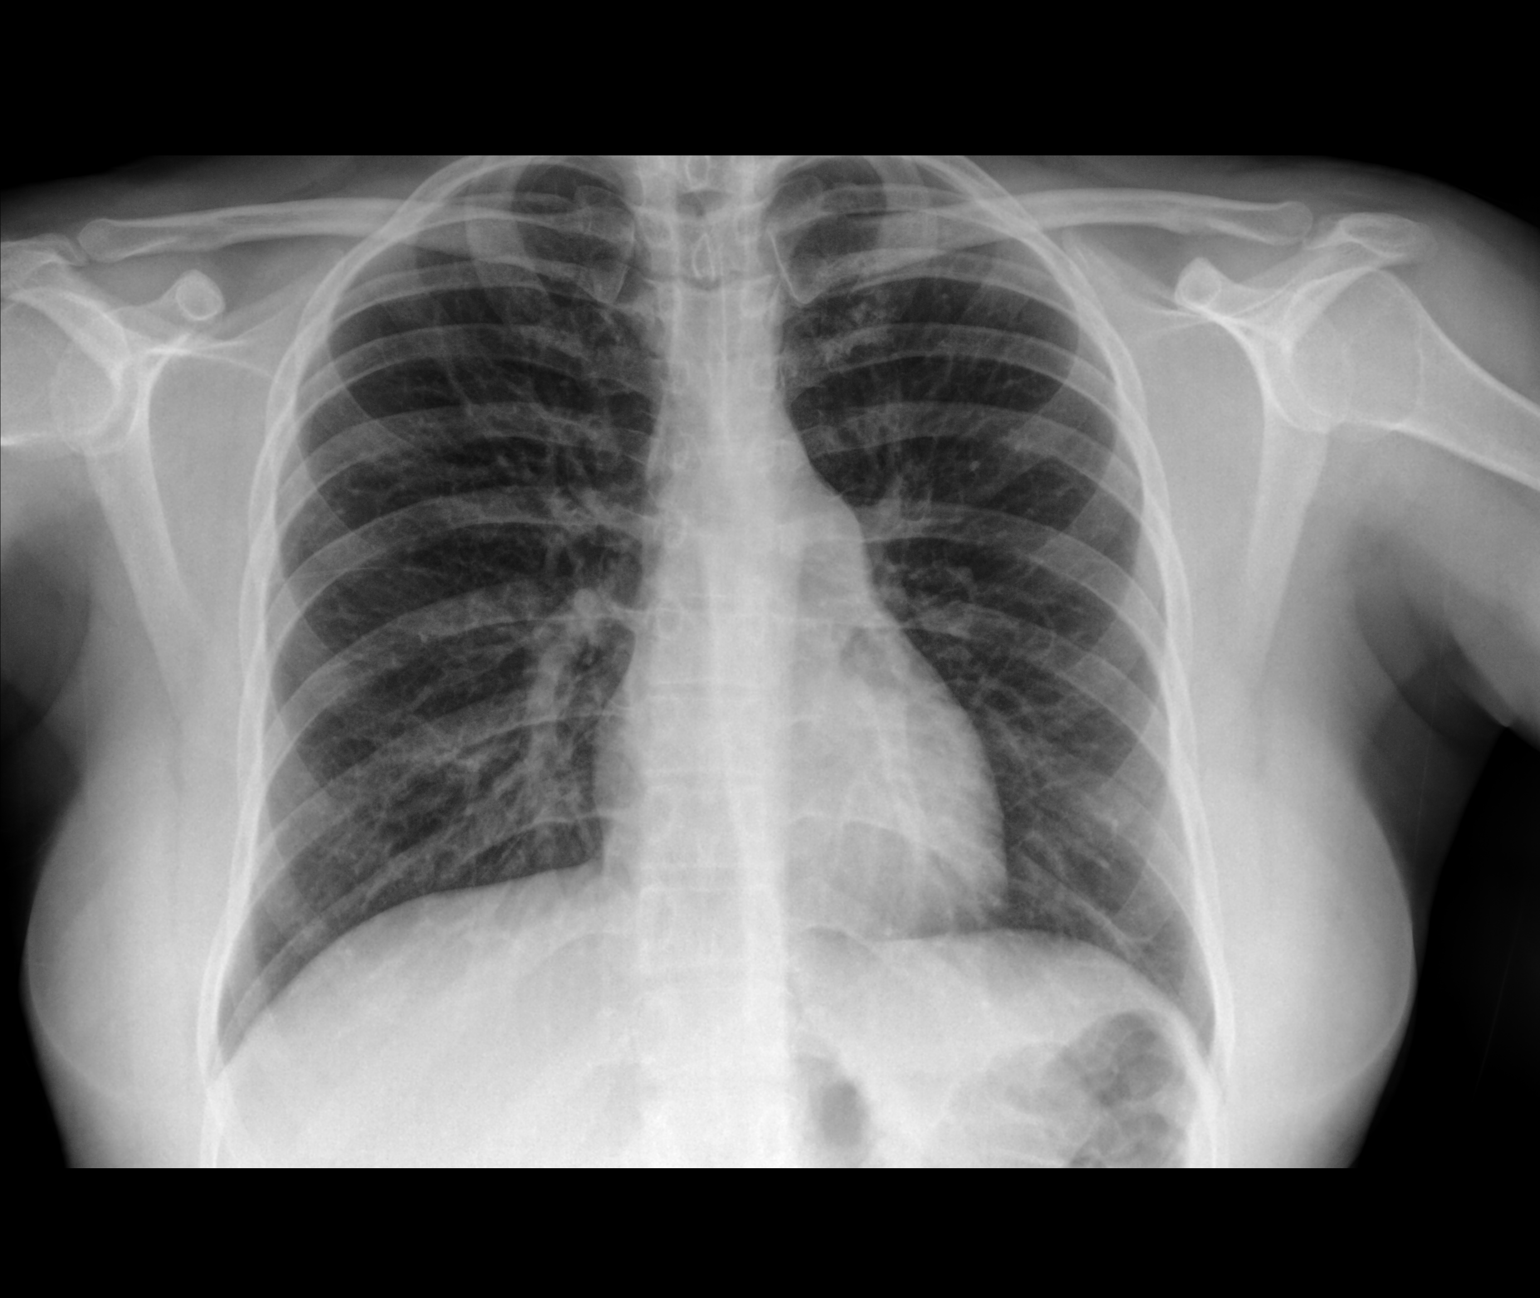

[chest lat]
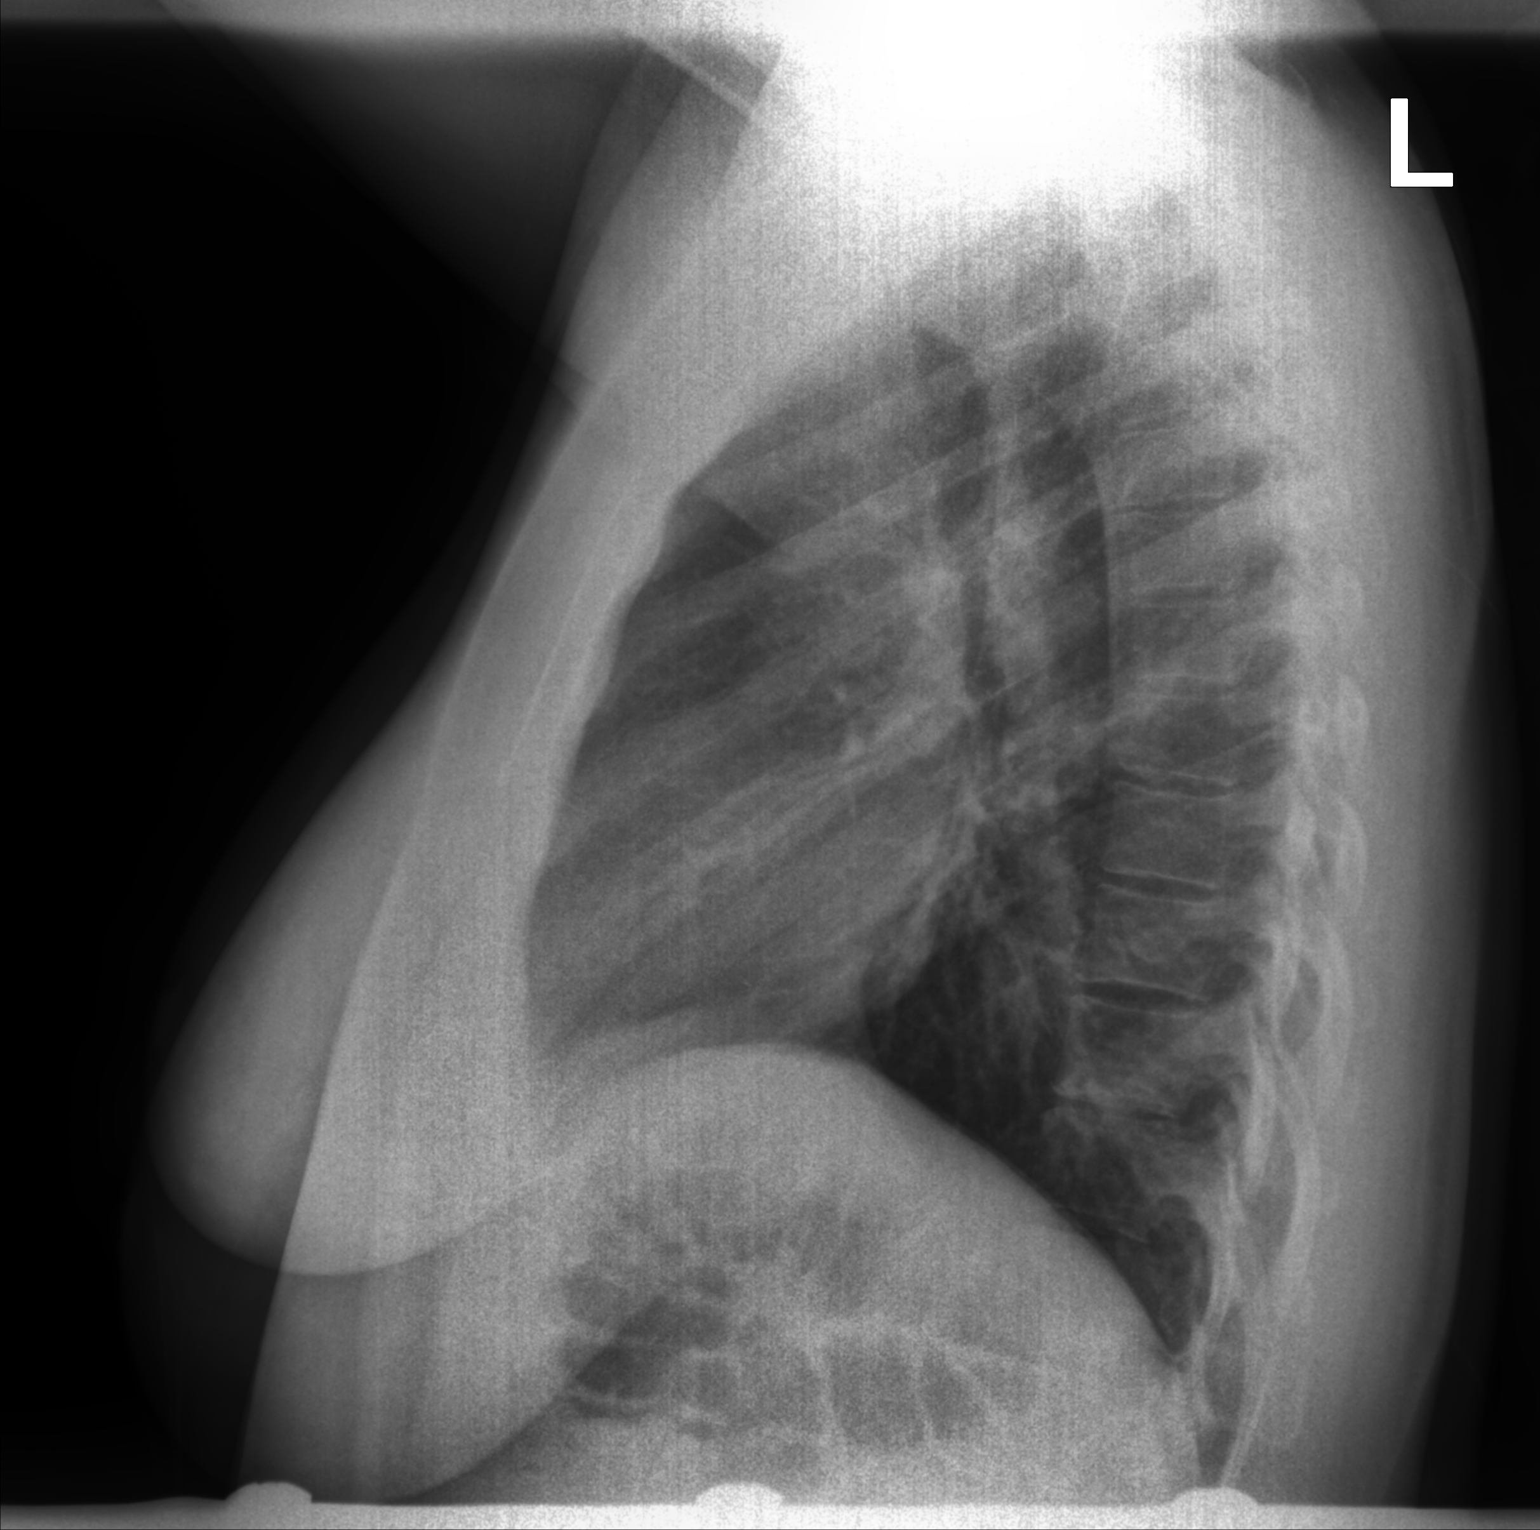

[2 of 2 positions shown; findings below may reference images not displayed]

FINDINGS: The heart size and mediastinal contours are within normal limits. No
focal consolidation. No pleural effusion. No pneumothorax. The
visualized skeletal structures are unremarkable.
IMPRESSION: No active cardiopulmonary disease.

## 2021-07-23 LAB — OB RESULTS CONSOLE RPR: RPR: NONREACTIVE

## 2021-07-23 LAB — OB RESULTS CONSOLE ANTIBODY SCREEN: Antibody Screen: NEGATIVE

## 2021-07-23 LAB — HEPATITIS C ANTIBODY: HCV Ab: NEGATIVE

## 2021-07-23 LAB — OB RESULTS CONSOLE HEPATITIS B SURFACE ANTIGEN: Hepatitis B Surface Ag: NEGATIVE

## 2021-07-23 LAB — OB RESULTS CONSOLE RUBELLA ANTIBODY, IGM: Rubella: IMMUNE

## 2021-07-23 LAB — OB RESULTS CONSOLE HIV ANTIBODY (ROUTINE TESTING): HIV: NONREACTIVE

## 2021-09-02 ENCOUNTER — Encounter (HOSPITAL_BASED_OUTPATIENT_CLINIC_OR_DEPARTMENT_OTHER): Payer: Self-pay

## 2021-09-02 ENCOUNTER — Ambulatory Visit (HOSPITAL_BASED_OUTPATIENT_CLINIC_OR_DEPARTMENT_OTHER): Admit: 2021-09-02 | Payer: 59 | Admitting: Obstetrics and Gynecology

## 2021-09-02 SURGERY — LAPAROSCOPIC GELPORT ASSISTED MYOMECTOMY
Anesthesia: General

## 2021-09-29 DIAGNOSIS — K219 Gastro-esophageal reflux disease without esophagitis: Secondary | ICD-10-CM | POA: Insufficient documentation

## 2022-01-06 LAB — OB RESULTS CONSOLE GBS: GBS: NEGATIVE

## 2022-01-15 ENCOUNTER — Encounter (HOSPITAL_COMMUNITY): Payer: Self-pay

## 2022-01-15 ENCOUNTER — Encounter (HOSPITAL_COMMUNITY): Payer: Self-pay | Admitting: *Deleted

## 2022-01-15 ENCOUNTER — Telehealth (HOSPITAL_COMMUNITY): Payer: Self-pay | Admitting: *Deleted

## 2022-01-15 NOTE — Telephone Encounter (Signed)
Preadmission screen  

## 2022-01-18 ENCOUNTER — Telehealth (HOSPITAL_COMMUNITY): Payer: Self-pay | Admitting: *Deleted

## 2022-01-18 NOTE — Telephone Encounter (Signed)
Preadmission screen  

## 2022-01-19 ENCOUNTER — Telehealth (HOSPITAL_COMMUNITY): Payer: Self-pay | Admitting: *Deleted

## 2022-01-19 NOTE — Telephone Encounter (Signed)
Preadmission screen  

## 2022-01-20 ENCOUNTER — Telehealth (HOSPITAL_COMMUNITY): Payer: Self-pay | Admitting: *Deleted

## 2022-01-20 NOTE — Telephone Encounter (Signed)
Preadmission screen  

## 2022-01-21 ENCOUNTER — Telehealth (HOSPITAL_COMMUNITY): Payer: Self-pay | Admitting: *Deleted

## 2022-01-21 NOTE — Telephone Encounter (Signed)
Preadmission screen  

## 2022-01-22 ENCOUNTER — Telehealth (HOSPITAL_COMMUNITY): Payer: Self-pay | Admitting: *Deleted

## 2022-01-22 NOTE — Telephone Encounter (Signed)
Preadmission screen  

## 2022-01-23 ENCOUNTER — Inpatient Hospital Stay (HOSPITAL_COMMUNITY)
Admission: AD | Admit: 2022-01-23 | Discharge: 2022-01-24 | Disposition: A | Discharge status: Home / Self Care | Payer: 59 | Attending: Obstetrics and Gynecology | Admitting: Obstetrics and Gynecology

## 2022-01-23 DIAGNOSIS — Z0371 Encounter for suspected problem with amniotic cavity and membrane ruled out: Secondary | ICD-10-CM | POA: Insufficient documentation

## 2022-01-23 DIAGNOSIS — O479 False labor, unspecified: Secondary | ICD-10-CM

## 2022-01-23 DIAGNOSIS — O471 False labor at or after 37 completed weeks of gestation: Secondary | ICD-10-CM | POA: Insufficient documentation

## 2022-01-23 DIAGNOSIS — Z3A38 38 weeks gestation of pregnancy: Secondary | ICD-10-CM | POA: Insufficient documentation

## 2022-01-23 NOTE — MAU Note (Signed)
Lost mucus plug on Thursday, reports some more mucus today. Has not had a cervical exam. Reports leaking some fluid today. No bleeding, Endorses good fetal movement.

## 2022-01-24 DIAGNOSIS — O471 False labor at or after 37 completed weeks of gestation: Secondary | ICD-10-CM | POA: Diagnosis not present

## 2022-01-24 DIAGNOSIS — Z3A38 38 weeks gestation of pregnancy: Secondary | ICD-10-CM | POA: Diagnosis not present

## 2022-01-24 DIAGNOSIS — Z0371 Encounter for suspected problem with amniotic cavity and membrane ruled out: Secondary | ICD-10-CM | POA: Diagnosis not present

## 2022-01-24 DIAGNOSIS — O479 False labor, unspecified: Secondary | ICD-10-CM | POA: Diagnosis present

## 2022-01-24 LAB — URINALYSIS, ROUTINE W REFLEX MICROSCOPIC
Bilirubin Urine: NEGATIVE
Glucose, UA: NEGATIVE mg/dL
Hgb urine dipstick: NEGATIVE
Ketones, ur: NEGATIVE mg/dL
Leukocytes,Ua: NEGATIVE
Nitrite: NEGATIVE
Protein, ur: NEGATIVE mg/dL
Specific Gravity, Urine: 1.004 — ABNORMAL LOW (ref 1.005–1.030)
pH: 7 (ref 5.0–8.0)

## 2022-01-24 LAB — POCT FERN TEST

## 2022-01-24 LAB — AMNISURE RUPTURE OF MEMBRANE (ROM) NOT AT ARMC: Amnisure ROM: NEGATIVE

## 2022-01-24 NOTE — MAU Provider Note (Signed)
S: [name] is a 36 y.o. G2P1001 at [redacted]w[redacted]d who presents to MAU today complaining of leaking of fluid since this morning. Very small amount. She denies vaginal bleeding. She endorses contractions. She reports normal fetal movement.    O: BP 124/73 (BP Location: Right Arm)   Pulse 90   Temp 98 F (36.7 C) (Axillary)   Resp 18   Ht '5\' 5"'$  (1.651 m)   Wt 85.7 kg   SpO2 99%   BMI 31.45 kg/m  GENERAL: Well-developed, well-nourished female in no acute distress.  HEAD: Normocephalic, atraumatic.  CHEST: Normal effort of breathing, regular heart rate ABDOMEN: Soft, nontender, gravid  Fern slide and amnisure negative   Cervical exam:  Dilation: 2.5 Effacement (%): 50 Cervical Position: Posterior Station: -3 Presentation: Vertex Exam by:: SConan Bowens RN   Fetal Monitoring: Baseline: 125 bpm Variability: Moderate  Accelerations: 15x15 Decelerations: None Contractions: Q 2-5 mins with irregular pattern   Results for orders placed or performed during the hospital encounter of 01/23/22 (from the past 24 hour(s))  Urinalysis, Routine w reflex microscopic Urine, Clean Catch     Status: Abnormal   Collection Time: 01/24/22 12:39 AM  Result Value Ref Range   Color, Urine YELLOW YELLOW   APPearance CLEAR CLEAR   Specific Gravity, Urine 1.004 (L) 1.005 - 1.030   pH 7.0 5.0 - 8.0   Glucose, UA NEGATIVE NEGATIVE mg/dL   Hgb urine dipstick NEGATIVE NEGATIVE   Bilirubin Urine NEGATIVE NEGATIVE   Ketones, ur NEGATIVE NEGATIVE mg/dL   Protein, ur NEGATIVE NEGATIVE mg/dL   Nitrite NEGATIVE NEGATIVE   Leukocytes,Ua NEGATIVE NEGATIVE   RBC / HPF 0-5 0 - 5 RBC/hpf   WBC, UA 0-5 0 - 5 WBC/hpf   Bacteria, UA RARE (A) NONE SEEN   Squamous Epithelial / LPF 6-10 0 - 5   Mucus PRESENT   Fern Test     Status: Normal   Collection Time: 01/24/22 12:39 AM  Result Value Ref Range   POCT Fern Test    Am[facility]isure rupture [facility] membrane (rom[facility]    Status: None   Collection Time:  01/24/22  1:28 AM  Result Value Ref Range   Amnisure ROM NEGATIVE      A: SIUP at 367w4dMembranes intact  P:  DC  home Labor precautions.   RaNoni Saupe, NP 01/24/2022 2:07 AM

## 2022-01-25 ENCOUNTER — Telehealth (HOSPITAL_COMMUNITY): Payer: Self-pay | Admitting: *Deleted

## 2022-01-25 NOTE — Telephone Encounter (Signed)
Preadmission screen  

## 2022-01-26 ENCOUNTER — Telehealth (HOSPITAL_COMMUNITY): Payer: Self-pay | Admitting: *Deleted

## 2022-01-26 NOTE — Telephone Encounter (Signed)
Preadmission screen  

## 2022-01-27 ENCOUNTER — Telehealth (HOSPITAL_COMMUNITY): Payer: Self-pay | Admitting: *Deleted

## 2022-01-27 NOTE — Telephone Encounter (Signed)
Preadmission screen  

## 2022-01-28 ENCOUNTER — Telehealth (HOSPITAL_COMMUNITY): Payer: Self-pay | Admitting: *Deleted

## 2022-01-28 NOTE — Telephone Encounter (Signed)
Preadmission screen  

## 2022-01-29 ENCOUNTER — Inpatient Hospital Stay (HOSPITAL_COMMUNITY): Payer: 59

## 2022-01-29 ENCOUNTER — Inpatient Hospital Stay (HOSPITAL_COMMUNITY)
Admission: AD | Admit: 2022-01-29 | Discharge: 2022-02-01 | DRG: 788 | Disposition: A | Discharge status: Home / Self Care | Payer: 59 | Attending: Obstetrics and Gynecology | Admitting: Obstetrics and Gynecology

## 2022-01-29 ENCOUNTER — Inpatient Hospital Stay (HOSPITAL_COMMUNITY): Payer: 59 | Admitting: Anesthesiology

## 2022-01-29 ENCOUNTER — Encounter (HOSPITAL_COMMUNITY): Payer: Self-pay | Admitting: Obstetrics

## 2022-01-29 DIAGNOSIS — Z98891 History of uterine scar from previous surgery: Secondary | ICD-10-CM

## 2022-01-29 DIAGNOSIS — Z8616 Personal history of COVID-19: Secondary | ICD-10-CM

## 2022-01-29 DIAGNOSIS — O3413 Maternal care for benign tumor of corpus uteri, third trimester: Secondary | ICD-10-CM | POA: Diagnosis present

## 2022-01-29 DIAGNOSIS — O26893 Other specified pregnancy related conditions, third trimester: Secondary | ICD-10-CM | POA: Diagnosis present

## 2022-01-29 DIAGNOSIS — J45909 Unspecified asthma, uncomplicated: Secondary | ICD-10-CM | POA: Diagnosis present

## 2022-01-29 DIAGNOSIS — Z3A39 39 weeks gestation of pregnancy: Secondary | ICD-10-CM | POA: Diagnosis not present

## 2022-01-29 DIAGNOSIS — O324XX0 Maternal care for high head at term, not applicable or unspecified: Secondary | ICD-10-CM | POA: Diagnosis present

## 2022-01-29 DIAGNOSIS — O9952 Diseases of the respiratory system complicating childbirth: Secondary | ICD-10-CM | POA: Diagnosis present

## 2022-01-29 DIAGNOSIS — D259 Leiomyoma of uterus, unspecified: Secondary | ICD-10-CM | POA: Diagnosis present

## 2022-01-29 DIAGNOSIS — O09523 Supervision of elderly multigravida, third trimester: Principal | ICD-10-CM | POA: Diagnosis present

## 2022-01-29 LAB — CBC
HCT: 35.9 % — ABNORMAL LOW (ref 36.0–46.0)
Hemoglobin: 11.8 g/dL — ABNORMAL LOW (ref 12.0–15.0)
MCH: 28.7 pg (ref 26.0–34.0)
MCHC: 32.9 g/dL (ref 30.0–36.0)
MCV: 87.3 fL (ref 80.0–100.0)
Platelets: 262 10*3/uL (ref 150–400)
RBC: 4.11 MIL/uL (ref 3.87–5.11)
RDW: 14.9 % (ref 11.5–15.5)
WBC: 6.4 10*3/uL (ref 4.0–10.5)
nRBC: 0 % (ref 0.0–0.2)

## 2022-01-29 LAB — RPR: RPR Ser Ql: NONREACTIVE

## 2022-01-29 LAB — TYPE AND SCREEN
ABO/RH(D): O POS
Antibody Screen: NEGATIVE

## 2022-01-29 MED ORDER — SOD CITRATE-CITRIC ACID 500-334 MG/5ML PO SOLN
30.0000 mL | ORAL | Status: DC | PRN
  Administered 2022-01-30: 30 mL via ORAL
  Filled 2022-01-29: qty 30

## 2022-01-29 MED ORDER — ACETAMINOPHEN 325 MG PO TABS: 650.0000 mg | ORAL_TABLET | ORAL | Status: DC | PRN

## 2022-01-29 MED ORDER — PHENYLEPHRINE 80 MCG/ML (10ML) SYRINGE FOR IV PUSH (FOR BLOOD PRESSURE SUPPORT): 80.0000 ug | PREFILLED_SYRINGE | INTRAVENOUS | Status: DC | PRN

## 2022-01-29 MED ORDER — DIPHENHYDRAMINE HCL 50 MG/ML IJ SOLN: 12.5000 mg | INTRAMUSCULAR | Status: DC | PRN

## 2022-01-29 MED ORDER — FENTANYL-BUPIVACAINE-NACL 0.5-0.125-0.9 MG/250ML-% EP SOLN
12.0000 mL/h | EPIDURAL | Status: DC | PRN
  Administered 2022-01-29: 12 mL/h via EPIDURAL
  Filled 2022-01-29 (×2): qty 250

## 2022-01-29 MED ORDER — LACTATED RINGERS IV SOLN: INTRAVENOUS | Status: DC

## 2022-01-29 MED ORDER — LACTATED RINGERS IV SOLN: 500.0000 mL | INTRAVENOUS | Status: DC | PRN

## 2022-01-29 MED ORDER — OXYTOCIN-SODIUM CHLORIDE 30-0.9 UT/500ML-% IV SOLN
1.0000 m[IU]/min | INTRAVENOUS | Status: DC
  Administered 2022-01-29: 2 m[IU]/min via INTRAVENOUS
  Filled 2022-01-29 (×2): qty 500

## 2022-01-29 MED ORDER — OXYCODONE-ACETAMINOPHEN 5-325 MG PO TABS: 1.0000 | ORAL_TABLET | ORAL | Status: DC | PRN

## 2022-01-29 MED ORDER — OXYTOCIN BOLUS FROM INFUSION: 333.0000 mL | Freq: Once | INTRAVENOUS | Status: DC

## 2022-01-29 MED ORDER — EPHEDRINE 5 MG/ML INJ: 10.0000 mg | INTRAVENOUS | Status: DC | PRN

## 2022-01-29 MED ORDER — ONDANSETRON HCL 4 MG/2ML IJ SOLN: 4.0000 mg | Freq: Four times a day (QID) | INTRAMUSCULAR | Status: DC | PRN

## 2022-01-29 MED ORDER — TERBUTALINE SULFATE 1 MG/ML IJ SOLN: 0.2500 mg | Freq: Once | INTRAMUSCULAR | Status: DC | PRN

## 2022-01-29 MED ORDER — LACTATED RINGERS IV SOLN: 500.0000 mL | Freq: Once | INTRAVENOUS | Status: DC

## 2022-01-29 MED ORDER — LIDOCAINE HCL (PF) 1 % IJ SOLN: 30.0000 mL | INTRAMUSCULAR | Status: DC | PRN

## 2022-01-29 MED ORDER — FENTANYL CITRATE (PF) 100 MCG/2ML IJ SOLN: 50.0000 ug | INTRAMUSCULAR | Status: DC | PRN

## 2022-01-29 MED ORDER — OXYTOCIN-SODIUM CHLORIDE 30-0.9 UT/500ML-% IV SOLN: 2.5000 [IU]/h | INTRAVENOUS | Status: DC

## 2022-01-29 MED ORDER — OXYCODONE-ACETAMINOPHEN 5-325 MG PO TABS: 2.0000 | ORAL_TABLET | ORAL | Status: DC | PRN

## 2022-01-29 MED ORDER — LIDOCAINE HCL (PF) 1 % IJ SOLN
INTRAMUSCULAR | Status: DC | PRN
  Administered 2022-01-29: 6 mL via EPIDURAL
  Administered 2022-01-29: 4 mL via EPIDURAL

## 2022-01-29 NOTE — H&P (Signed)
36 y.o. G2P1001 @ 1w2dpresents for  induction of labor for advanced maternal age.  Otherwise has good fetal movement and no bleeding.  Pregnancy complicated by: AMA: low risk NIPT Fibroid uterus:  Right anterior pedunculated fibroid measuring 12 x 11 x 6 cm History of PPH with G1:  Presented with PROM, long IOL, EBL 2200 mL.  Required multiple uterotonics and Bakri balloon. Asthma: on daily inhaled corticosteroid  Past Medical History:  Diagnosis Date   Asthma    COVID-19    Uterine fibroid     Past Surgical History:  Procedure Laterality Date   NO PAST SURGERIES      OB History  Gravida Para Term Preterm AB Living  '2 1 1     1  '$ SAB IAB Ectopic Multiple Live Births        0 1    # Outcome Date GA Lbr Len/2nd Weight Sex Delivery Anes PTL Lv  2 Current           1 Term 05/30/20 318w5d7:56 / 01:22 2785 g F Vag-Spont EPI  LIV     Birth Comments: none    Social History   Socioeconomic History   Marital status: Married    Spouse name: Alexander   Number of children: Not on file   Years of education: 18   Highest education level: Master's degree (e.g., MA, MS, MEng, MEd, MSW, MBA)  Occupational History   Occupation: customer service  Tobacco Use   Smoking status: Never   Smokeless tobacco: Never  Vaping Use   Vaping Use: Never used  Substance and Sexual Activity   Alcohol use: Not Currently   Drug use: Not Currently   Sexual activity: Yes    Birth control/protection: None  Other Topics Concern   Not on file  Social History Narrative   Not on file   Social Determinants of Health   Financial Resource Strain: Not on file  Food Insecurity: Not on file  Transportation Needs: Not on file  Physical Activity: Not on file  Stress: Not on file  Social Connections: Not on file  Intimate Partner Violence: Not on file   Patient has no known allergies.    Prenatal Transfer Tool  Maternal Diabetes: No Genetic Screening: Normal Maternal Ultrasounds/Referrals:  Normal Fetal Ultrasounds or other Referrals:  Referred to Materal Fetal Medicine   see above for fibroids Maternal Substance Abuse:  No Significant Maternal Medications:  None Significant Maternal Lab Results: Group B Strep negative  ABO, Rh: --/--/O POS (05/26 0054) Antibody: NEG (05/26 0054) Rubella: Immune (11/17 0000) RPR: Nonreactive (11/17 0000)  HBsAg: Negative (11/17 0000)  HIV: Non-reactive (11/17 0000)  GBS: Negative/-- (05/03 0000)    Vitals:   01/29/22 0630 01/29/22 0700  BP: 113/74 117/72  Pulse: 81 75  Resp: 16 16  Temp: 98 F (36.7 C)      General:  NAD Abdomen:  soft, gravid, EFW 8.5-9# Ex:  no edema SVE:  4/60/-2, AROM clear fluid FHTs:  120s, moderate variability,  Toco:  q3-4  Growth USKoreat 36 weeks:  EFW 7lb 11 oz (88%), AC>90%  A/P   [age].o. G2P1001 3968w2desents with IOL for AMA and borderline fetal macrosomia Cervix favorable, started on pitocin overnight.  Now s/p AROM.  Continue pitocin  History of PPH:  Discussed option of trying to avoid IOL.  With recent growth US [place]ath EFW 88%, discussed risks / benefits and elected for 39 week IOL.  Consider TXA with  delivery FSR/ vtx/ GBS negative  [place]

## 2022-01-29 NOTE — Anesthesia Preprocedure Evaluation (Signed)
Anesthesia Evaluation  Patient identified by MRN, date of birth, ID band Patient awake    Reviewed: Allergy & Precautions, H&P , NPO status , Patient's Chart, lab work & pertinent test results  History of Anesthesia Complications Negative for: history of anesthetic complications  Airway Mallampati: II  TM Distance: >3 FB     Dental   Pulmonary asthma ,    Pulmonary exam normal        Cardiovascular negative cardio ROS   Rhythm:regular Rate:Normal     Neuro/Psych negative neurological ROS  negative psych ROS   GI/Hepatic negative GI ROS, Neg liver ROS,   Endo/Other  negative endocrine ROS  Renal/GU negative Renal ROS  negative genitourinary   Musculoskeletal   Abdominal   Peds  Hematology negative hematology ROS (+)   Anesthesia Other Findings   Reproductive/Obstetrics (+) Pregnancy                             Anesthesia Physical Anesthesia Plan  ASA: 2  Anesthesia Plan: Epidural   Post-op Pain Management:    Induction:   PONV Risk Score and Plan:   Airway Management Planned:   Additional Equipment:   Intra-op Plan:   Post-operative Plan:   Informed Consent: I have reviewed the patients History and Physical, chart, labs and discussed the procedure including the risks, benefits and alternatives for the proposed anesthesia with the patient or authorized representative who has indicated his/her understanding and acceptance.       Plan Discussed with:   Anesthesia Plan Comments:         Anesthesia Quick Evaluation

## 2022-01-29 NOTE — Progress Notes (Signed)
Repeat SVE by RN just prior to my arrival 4-5/70/-2.  Patient reports recent increase in pelvic pressure  Toco: q2-3 minutes EFM Category 1  A/P: G2P1 @ [redacted]w[redacted]d Still in latent labor.  Continue pitocin Anticipate SVD

## 2022-01-29 NOTE — Anesthesia Procedure Notes (Signed)
Epidural Patient location during procedure: OB Start time: 01/29/2022 9:09 AM End time: 01/29/2022 9:19 AM  Staffing Anesthesiologist: Lidia Collum, MD Performed: anesthesiologist   Preanesthetic Checklist Completed: patient identified, IV checked, risks and benefits discussed, monitors and equipment checked, pre-op evaluation and timeout performed  Epidural Patient position: sitting Prep: DuraPrep Patient monitoring: heart rate, continuous pulse ox and blood pressure Approach: midline Location: L3-L4 Injection technique: LOR air  Needle:  Needle type: Tuohy  Needle gauge: 17 G Needle length: 9 cm Needle insertion depth: 5 cm Catheter type: closed end flexible Catheter size: 19 Gauge Catheter at skin depth: 10 cm Test dose: negative  Assessment Events: blood not aspirated, injection not painful, no injection resistance, no paresthesia and negative IV test  Additional Notes Reason for block:procedure for pain

## 2022-01-29 NOTE — Progress Notes (Signed)
Patient with increase in pelvic pressure  BP (!) 95/59   Pulse 79   Temp 98.3 F (36.8 C) (Axillary)   Resp 18   Ht '5\' 5"'$  (1.651 m)   Wt 87.4 kg   BMI 32.07 kg/m   Toco: q2-3 minutes, MVUs not adequate at 140 EFM: 120s, moderate variability, category 1 SVE: 7/70/-1  Plan: Continue to titrate pitocin to adequate MVUs Pelvis adequate, anticipate SVD

## 2022-01-30 ENCOUNTER — Encounter (HOSPITAL_COMMUNITY): Payer: Self-pay | Admitting: Obstetrics

## 2022-01-30 ENCOUNTER — Encounter (HOSPITAL_COMMUNITY)
Admission: AD | Disposition: A | Discharge status: Home / Self Care | Payer: Self-pay | Source: Home / Self Care | Attending: Obstetrics and Gynecology

## 2022-01-30 DIAGNOSIS — O3413 Maternal care for benign tumor of corpus uteri, third trimester: Secondary | ICD-10-CM

## 2022-01-30 DIAGNOSIS — O9952 Diseases of the respiratory system complicating childbirth: Secondary | ICD-10-CM

## 2022-01-30 DIAGNOSIS — J45909 Unspecified asthma, uncomplicated: Secondary | ICD-10-CM

## 2022-01-30 DIAGNOSIS — Z98891 History of uterine scar from previous surgery: Secondary | ICD-10-CM

## 2022-01-30 DIAGNOSIS — Z3A39 39 weeks gestation of pregnancy: Secondary | ICD-10-CM

## 2022-01-30 SURGERY — Surgical Case
Anesthesia: Epidural

## 2022-01-30 MED ORDER — MEPERIDINE HCL 25 MG/ML IJ SOLN
INTRAMUSCULAR | Status: AC
  Filled 2022-01-30: qty 1

## 2022-01-30 MED ORDER — SODIUM CHLORIDE 0.9 % IV SOLN
INTRAVENOUS | Status: DC | PRN
  Administered 2022-01-30: 500 mg via INTRAVENOUS

## 2022-01-30 MED ORDER — IBUPROFEN 600 MG PO TABS
600.0000 mg | ORAL_TABLET | Freq: Four times a day (QID) | ORAL | Status: DC
  Administered 2022-01-30 – 2022-02-01 (×6): 600 mg via ORAL
  Filled 2022-01-30 (×6): qty 1

## 2022-01-30 MED ORDER — OXYCODONE HCL 5 MG/5ML PO SOLN: 5.0000 mg | Freq: Once | ORAL | Status: DC | PRN

## 2022-01-30 MED ORDER — MENTHOL 3 MG MT LOZG: 1.0000 | LOZENGE | OROMUCOSAL | Status: DC | PRN

## 2022-01-30 MED ORDER — NALOXONE HCL 4 MG/10ML IJ SOLN: 1.0000 ug/kg/h | INTRAVENOUS | Status: DC | PRN

## 2022-01-30 MED ORDER — SENNOSIDES-DOCUSATE SODIUM 8.6-50 MG PO TABS
2.0000 | ORAL_TABLET | Freq: Every day | ORAL | Status: DC
  Administered 2022-01-31 – 2022-02-01 (×2): 2 via ORAL
  Filled 2022-01-30 (×2): qty 2

## 2022-01-30 MED ORDER — NALOXONE HCL 0.4 MG/ML IJ SOLN: 0.4000 mg | INTRAMUSCULAR | Status: DC | PRN

## 2022-01-30 MED ORDER — SIMETHICONE 80 MG PO CHEW: 80.0000 mg | CHEWABLE_TABLET | ORAL | Status: DC | PRN

## 2022-01-30 MED ORDER — DEXMEDETOMIDINE (PRECEDEX) IN NS 20 MCG/5ML (4 MCG/ML) IV SYRINGE
PREFILLED_SYRINGE | INTRAVENOUS | Status: AC
  Filled 2022-01-30: qty 5

## 2022-01-30 MED ORDER — ACETAMINOPHEN 500 MG PO TABS
1000.0000 mg | ORAL_TABLET | Freq: Four times a day (QID) | ORAL | Status: DC
  Administered 2022-01-30 – 2022-02-01 (×8): 1000 mg via ORAL
  Filled 2022-01-30 (×8): qty 2

## 2022-01-30 MED ORDER — KETOROLAC TROMETHAMINE 30 MG/ML IJ SOLN: 30.0000 mg | Freq: Four times a day (QID) | INTRAMUSCULAR | Status: AC | PRN

## 2022-01-30 MED ORDER — DIBUCAINE (PERIANAL) 1 % EX OINT: 1.0000 "application " | TOPICAL_OINTMENT | CUTANEOUS | Status: DC | PRN

## 2022-01-30 MED ORDER — PRENATAL MULTIVITAMIN CH
1.0000 | ORAL_TABLET | Freq: Every day | ORAL | Status: DC
  Administered 2022-01-31 – 2022-02-01 (×2): 1 via ORAL
  Filled 2022-01-30 (×2): qty 1

## 2022-01-30 MED ORDER — ZOLPIDEM TARTRATE 5 MG PO TABS: 5.0000 mg | ORAL_TABLET | Freq: Every evening | ORAL | Status: DC | PRN

## 2022-01-30 MED ORDER — SCOPOLAMINE 1 MG/3DAYS TD PT72
1.0000 | MEDICATED_PATCH | Freq: Once | TRANSDERMAL | Status: DC
  Administered 2022-01-30: 1.5 mg via TRANSDERMAL
  Filled 2022-01-30: qty 1

## 2022-01-30 MED ORDER — DIPHENHYDRAMINE HCL 25 MG PO CAPS: 25.0000 mg | ORAL_CAPSULE | ORAL | Status: DC | PRN

## 2022-01-30 MED ORDER — SODIUM CHLORIDE 0.9% FLUSH: 3.0000 mL | INTRAVENOUS | Status: DC | PRN

## 2022-01-30 MED ORDER — METHYLERGONOVINE MALEATE 0.2 MG/ML IJ SOLN
INTRAMUSCULAR | Status: AC
  Filled 2022-01-30: qty 1

## 2022-01-30 MED ORDER — SIMETHICONE 80 MG PO CHEW
80.0000 mg | CHEWABLE_TABLET | Freq: Three times a day (TID) | ORAL | Status: DC
  Administered 2022-01-30 – 2022-02-01 (×4): 80 mg via ORAL
  Filled 2022-01-30 (×5): qty 1

## 2022-01-30 MED ORDER — MEPERIDINE HCL 25 MG/ML IJ SOLN
6.2500 mg | INTRAMUSCULAR | Status: DC | PRN
  Administered 2022-01-30: 6.25 mg via INTRAVENOUS

## 2022-01-30 MED ORDER — HYDROMORPHONE HCL 1 MG/ML IJ SOLN: 0.2500 mg | INTRAMUSCULAR | Status: DC | PRN

## 2022-01-30 MED ORDER — LIDOCAINE-EPINEPHRINE (PF) 2 %-1:200000 IJ SOLN
INTRAMUSCULAR | Status: DC | PRN
  Administered 2022-01-30: 2 mL via EPIDURAL
  Administered 2022-01-30: 10 mL via EPIDURAL
  Administered 2022-01-30: 5 mL via EPIDURAL
  Administered 2022-01-30: 1 mL via EPIDURAL

## 2022-01-30 MED ORDER — MORPHINE SULFATE (PF) 0.5 MG/ML IJ SOLN
INTRAMUSCULAR | Status: DC | PRN
  Administered 2022-01-30: 3 mg via EPIDURAL

## 2022-01-30 MED ORDER — LIDOCAINE-EPINEPHRINE (PF) 2 %-1:200000 IJ SOLN
INTRAMUSCULAR | Status: AC
  Filled 2022-01-30: qty 20

## 2022-01-30 MED ORDER — DIPHENHYDRAMINE HCL 25 MG PO CAPS: 25.0000 mg | ORAL_CAPSULE | Freq: Four times a day (QID) | ORAL | Status: DC | PRN

## 2022-01-30 MED ORDER — OXYCODONE HCL 5 MG PO TABS: 5.0000 mg | ORAL_TABLET | ORAL | Status: DC | PRN

## 2022-01-30 MED ORDER — OXYCODONE HCL 5 MG PO TABS: 5.0000 mg | ORAL_TABLET | Freq: Once | ORAL | Status: DC | PRN

## 2022-01-30 MED ORDER — COCONUT OIL OIL: 1.0000 "application " | TOPICAL_OIL | Status: DC | PRN

## 2022-01-30 MED ORDER — WITCH HAZEL-GLYCERIN EX PADS: 1.0000 "application " | MEDICATED_PAD | CUTANEOUS | Status: DC | PRN

## 2022-01-30 MED ORDER — CEFAZOLIN SODIUM-DEXTROSE 2-4 GM/100ML-% IV SOLN
INTRAVENOUS | Status: AC
  Filled 2022-01-30: qty 100

## 2022-01-30 MED ORDER — TETANUS-DIPHTH-ACELL PERTUSSIS 5-2.5-18.5 LF-MCG/0.5 IM SUSY: 0.5000 mL | PREFILLED_SYRINGE | Freq: Once | INTRAMUSCULAR | Status: DC

## 2022-01-30 MED ORDER — OXYTOCIN-SODIUM CHLORIDE 30-0.9 UT/500ML-% IV SOLN
INTRAVENOUS | Status: DC | PRN
  Administered 2022-01-30: 300 mL via INTRAVENOUS

## 2022-01-30 MED ORDER — PROMETHAZINE HCL 25 MG/ML IJ SOLN: 6.2500 mg | INTRAMUSCULAR | Status: DC | PRN

## 2022-01-30 MED ORDER — TRANEXAMIC ACID-NACL 1000-0.7 MG/100ML-% IV SOLN
INTRAVENOUS | Status: AC
  Filled 2022-01-30: qty 100

## 2022-01-30 MED ORDER — FENTANYL CITRATE (PF) 100 MCG/2ML IJ SOLN
INTRAMUSCULAR | Status: AC
Start: 2022-01-30 — End: ?
  Filled 2022-01-30: qty 2

## 2022-01-30 MED ORDER — TRANEXAMIC ACID-NACL 1000-0.7 MG/100ML-% IV SOLN
INTRAVENOUS | Status: DC | PRN
  Administered 2022-01-30: 1000 mg via INTRAVENOUS

## 2022-01-30 MED ORDER — DIPHENHYDRAMINE HCL 50 MG/ML IJ SOLN: 12.5000 mg | INTRAMUSCULAR | Status: DC | PRN

## 2022-01-30 MED ORDER — LACTATED RINGERS IV SOLN: INTRAVENOUS | Status: DC | PRN

## 2022-01-30 MED ORDER — CEFAZOLIN SODIUM-DEXTROSE 2-3 GM-%(50ML) IV SOLR
INTRAVENOUS | Status: DC | PRN
Start: 2022-01-30 — End: 2022-01-30
  Administered 2022-01-30: 2 g via INTRAVENOUS

## 2022-01-30 MED ORDER — ONDANSETRON HCL 4 MG/2ML IJ SOLN: 4.0000 mg | Freq: Three times a day (TID) | INTRAMUSCULAR | Status: DC | PRN

## 2022-01-30 MED ORDER — FENTANYL CITRATE (PF) 100 MCG/2ML IJ SOLN
INTRAMUSCULAR | Status: DC | PRN
  Administered 2022-01-30: 100 ug via EPIDURAL

## 2022-01-30 MED ORDER — ACETAMINOPHEN 10 MG/ML IV SOLN: 1000.0000 mg | Freq: Once | INTRAVENOUS | Status: DC | PRN

## 2022-01-30 MED ORDER — DEXMEDETOMIDINE (PRECEDEX) IN NS 20 MCG/5ML (4 MCG/ML) IV SYRINGE
PREFILLED_SYRINGE | INTRAVENOUS | Status: DC | PRN
  Administered 2022-01-30 (×2): 8 ug via INTRAVENOUS

## 2022-01-30 MED ORDER — MORPHINE SULFATE (PF) 0.5 MG/ML IJ SOLN
INTRAMUSCULAR | Status: AC
  Filled 2022-01-30: qty 10

## 2022-01-30 MED ORDER — METHYLERGONOVINE MALEATE 0.2 MG/ML IJ SOLN
INTRAMUSCULAR | Status: DC | PRN
  Administered 2022-01-30: .2 mg via INTRAMUSCULAR

## 2022-01-30 MED ORDER — ONDANSETRON HCL 4 MG/2ML IJ SOLN
INTRAMUSCULAR | Status: DC | PRN
  Administered 2022-01-30: 4 mg via INTRAVENOUS

## 2022-01-30 MED ORDER — KETOROLAC TROMETHAMINE 30 MG/ML IJ SOLN
30.0000 mg | Freq: Four times a day (QID) | INTRAMUSCULAR | Status: AC | PRN
  Administered 2022-01-30: 30 mg via INTRAVENOUS
  Filled 2022-01-30: qty 1

## 2022-01-30 MED ORDER — ONDANSETRON HCL 4 MG/2ML IJ SOLN
INTRAMUSCULAR | Status: AC
  Filled 2022-01-30: qty 2

## 2022-01-30 MED ORDER — OXYTOCIN-SODIUM CHLORIDE 30-0.9 UT/500ML-% IV SOLN
2.5000 [IU]/h | INTRAVENOUS | Status: AC
  Administered 2022-01-31: 2.5 [IU]/h via INTRAVENOUS
  Filled 2022-01-30: qty 500

## 2022-01-30 MED ORDER — OXYTOCIN-SODIUM CHLORIDE 30-0.9 UT/500ML-% IV SOLN
INTRAVENOUS | Status: AC
  Filled 2022-01-30: qty 500

## 2022-01-30 MED ORDER — BUPIVACAINE HCL (PF) 0.25 % IJ SOLN
INTRAMUSCULAR | Status: DC | PRN
  Administered 2022-01-30 (×2): 10 mL via EPIDURAL

## 2022-01-30 SURGICAL SUPPLY — 33 items
BENZOIN TINCTURE PRP APPL 2/3 (GAUZE/BANDAGES/DRESSINGS) ×1 IMPLANT
CHLORAPREP W/TINT 26ML (MISCELLANEOUS) ×6 IMPLANT
CLAMP CORD UMBIL (MISCELLANEOUS) ×3 IMPLANT
CLOTH BEACON ORANGE TIMEOUT ST (SAFETY) ×3 IMPLANT
DRSG OPSITE POSTOP 4X10 (GAUZE/BANDAGES/DRESSINGS) ×3 IMPLANT
ELECT REM PT RETURN 9FT ADLT (ELECTROSURGICAL) ×3
ELECTRODE REM PT RTRN 9FT ADLT (ELECTROSURGICAL) ×2 IMPLANT
EXTRACTOR VACUUM KIWI (MISCELLANEOUS) IMPLANT
GLOVE BIO SURGEON STRL SZ 6.5 (GLOVE) ×3 IMPLANT
GLOVE BIOGEL PI IND STRL 7.0 (GLOVE) ×2 IMPLANT
GLOVE BIOGEL PI INDICATOR 7.0 (GLOVE) ×1
GOWN STRL REUS W/TWL LRG LVL3 (GOWN DISPOSABLE) ×6 IMPLANT
KIT ABG SYR 3ML LUER SLIP (SYRINGE) IMPLANT
NDL HYPO 25X5/8 SAFETYGLIDE (NEEDLE) IMPLANT
NEEDLE HYPO 25X5/8 SAFETYGLIDE (NEEDLE) IMPLANT
NS IRRIG 1000ML POUR BTL (IV SOLUTION) ×3 IMPLANT
PACK C SECTION WH (CUSTOM PROCEDURE TRAY) ×3 IMPLANT
PAD OB MATERNITY 4.3X12.25 (PERSONAL CARE ITEMS) ×3 IMPLANT
RTRCTR C-SECT PINK 25CM LRG (MISCELLANEOUS) ×3 IMPLANT
STRIP CLOSURE SKIN 1/2X4 (GAUZE/BANDAGES/DRESSINGS) ×1 IMPLANT
SUT CHROMIC 1 CTX 36 (SUTURE) ×7 IMPLANT
SUT PLAIN 0 NONE (SUTURE) IMPLANT
SUT PLAIN 2 0 XLH (SUTURE) ×3 IMPLANT
SUT VIC AB 0 CT1 27 (SUTURE) ×2
SUT VIC AB 0 CT1 27XBRD ANBCTR (SUTURE) ×4 IMPLANT
SUT VIC AB 2-0 CT1 27 (SUTURE) ×1
SUT VIC AB 2-0 CT1 TAPERPNT 27 (SUTURE) ×2 IMPLANT
SUT VIC AB 3-0 CT1 27 (SUTURE)
SUT VIC AB 3-0 CT1 TAPERPNT 27 (SUTURE) IMPLANT
SUT VIC AB 4-0 KS 27 (SUTURE) ×3 IMPLANT
TOWEL OR 17X24 6PK STRL BLUE (TOWEL DISPOSABLE) ×3 IMPLANT
TRAY FOLEY W/BAG SLVR 14FR LF (SET/KITS/TRAYS/PACK) ×3 IMPLANT
WATER STERILE IRR 1000ML POUR (IV SOLUTION) ×3 IMPLANT

## 2022-01-30 NOTE — Op Note (Signed)
Operative Note    Preoperative Diagnosis Term Pregnancy at 39+ weeks Arrest of descent Fibroid uterus with h/o significant postpartum hemorrhage  Postoperative Diagnosis Same  Procedure Primary low transverse c-section with two layer closure of uterus  Surgeon Paula Compton, MD  Anesthesia Epidural  Fluids: EBL 482m UOP 2511mblood tinged prior and clearing with c-section IVF 230050mFindings A viable female infant in the vertex presentation.  Apgars 9,9 Weight 9#5oz Uterus with pedunculated fibroid off maternal right fundus with a 4-5cm stalk. Normal tubes and ovaries  Specimen Placenta to L&D  Procedure Note  Patient was taken to the operating room where epidural anesthesia was boosted and found to be adequate by Allis clamp test. She was prepped and draped in the normal sterile fashion in the dorsal supine position with a leftward tilt. An appropriate time out was performed. A Pfannenstiel skin incision was then made with the scalpel and carried through to the underlying layer of fascia by sharp dissection and Bovie cautery. The fascia was nicked in the midline and the incision was extended laterally with Mayo scissors. The inferior aspect of the incision was grasped Coker clamps and dissected off the underlying rectus muscles. In a similar fashion the superior aspect was dissected off the rectus muscles. Rectus muscles were separated in the midline and the peritoneal cavity entered bluntly. The peritoneal incision was then extended both superiorly and inferiorly with careful attention to avoid both bowel and bladder. The Alexis self-retaining wound retractor was then placed within the incision and the lower uterine segment exposed. The bladder flap was developed with Metzenbaum scissors and pushed away from the lower uterine segment. The lower uterine segment was then incised in a transverse fashion and the cavity itself entered bluntly. The incision was extended bluntly. The  infant's head was then lifted and delivered from the incision.  The remainder of the infant delivered and the nose and mouth bulb suctioned with the cord clamped and cut as well. The infant was handed off to the waiting pediatricians after 1 minute delay.  The placenta was then spontaneously expressed from the uterus and the uterus cleared of all clots and debris with moist lap sponge. The uterus was boggy so massage and pitocin administered as well as methergine. Bleeding was not excessive.  The uterine incision was then repaired in 2 layers the first layer was a running locked layer 1-0 chromic and the second an imbricating layer of the same suture. The tubes and ovaries were inspected and the gutters cleared of all clots and debris. The uterine incision was inspected and found to be hemostatic. All instruments and sponges as well as the Alexis retractor were then removed from the abdomen. The rectus muscles and peritoneum were then reapproximated with a running suture of 2-0 Vicryl. The fascia was then closed with 0 Vicryl in a running fashion. Subcutaneous tissue was reapproximated with 3-0 plain in a running fashion. The skin was closed with a subcuticular stitch of 4-0 Vicryl on a Keith needle and then reinforced with benzoin and Steri-Strips. At the conclusion of the procedure all instruments and sponge counts were correct. Patient was taken to the recovery room in good condition with her baby accompanying her skin to skin.    D/w parents circumcision and they desire to proceed.

## 2022-01-30 NOTE — Transfer of Care (Signed)
Immediate Anesthesia Transfer of Care Note  Patient: Linda Guerra  Procedure(s) Performed: CESAREAN SECTION APPLICATION OF CELL SAVER  Patient Location: PACU  Anesthesia Type:Epidural  Level of Consciousness: awake  Airway & Oxygen Therapy: Patient Spontanous Breathing  Post-op Assessment: Report given to RN and Post -op Vital signs reviewed and stable  Post vital signs: Reviewed and stable  Last Vitals:  Vitals Value Taken Time  BP 101/56 01/30/22 1018  Temp    Pulse 90 01/30/22 1020  Resp 24 01/30/22 1020  SpO2 99 % 01/30/22 1020  Vitals shown include unvalidated device data.  Last Pain:  Vitals:   01/30/22 0851  TempSrc: Oral  PainSc:          Complications: No notable events documented.

## 2022-01-30 NOTE — Progress Notes (Signed)
Struggling with pressure with contractions despite epidural redosing  Toco: q2-3 minutes EFM: 140s, moderate variability, category 1 SVE: anterior lip / 100 / 0 station  With pushing, unable to fully reduce anterior lip.  Will sit in throne position for 30 minutes and recheck.

## 2022-01-30 NOTE — Progress Notes (Signed)
Patient ID: Linda Guerra, female   DOB: 1985-10-29, 36 y.o.   MRN: [id number] Pt reached complete dilation and started pushing at about 725am.  I sat in the room and pushed with her for most of last hour and no real descent of vertex despite good maternal effort.  FHR overall good with good variability and accels, occasional mild variables  C/C/0  d/w pt and husband that progress is minimal and we can either keep pushing or proceed to c-section.  I feel she has very good effort but the vertex is just not descending.  She agrees and is ready to proceed with c-section. We discussed c-section risks and benefits in detail including bleeding, infection and possible damage to bowel and bladder.  She is aware that she already had a higher postpartum hemorrhage risk and will accept a blood transfusion if needed.  We are going to pretreat with TXA and have cell saver available for surgery.  OR notified and will be ready when room opened.  Pitocin off in meantime to rest the uterus

## 2022-01-30 NOTE — Lactation Note (Signed)
This note was copied from a baby's chart. Lactation Consultation Note  Patient Name: Linda Guerra MAYOK'H Date: 01/30/2022   Age:36 hours P2, term female infant. Mom declined [place] would like to be seen by [facility] services in morning. Maternal Data    Feeding    LATCH Score Latch: Too sleepy or reluctant, no latch achieved, no sucking elicited.  Audible Swallowing: None  Type of Nipple: Everted at rest and after stimulation  Comfort (Breast/Nipple): Soft / non-tender  Hold (Positioning): No assistance needed to correctly position infant at breast.  LATCH Score: 6   Lactation Tools Discussed/Used    Interventions    Discharge    Consult Status      Vicente Serene 01/30/2022, 11:57 PM

## 2022-01-30 NOTE — Progress Notes (Signed)
Patient with significant increase in pelvice pressure  BP 106/81   Pulse 95   Temp 98.3 F (36.8 C) (Axillary)   Resp 16   Ht '5\' 5"'$  (1.651 m)   Wt 87.4 kg   BMI 32.07 kg/m   Toco: q2-3 minutes EFM: 120s, moderate variability, + accelerations SVE: 9/100/0  A/P:  g2P1 @ 65w3dwith IOL for AMA Protracted active labor.  MVUs have often been inadequate, but with continued slow progress with pitocin. NOw with significant descent since last check Anticipate SVD

## 2022-01-30 NOTE — Anesthesia Postprocedure Evaluation (Signed)
Anesthesia Post Note  Patient: Linda Guerra  Procedure(s) Performed: [place] SAVER     Patient location during evaluation: Mother Baby Anesthesia Type: Epidural Level of consciousness: awake and alert Pain management: pain level controlled Vital Signs Assessment: post-procedure vital signs reviewed and stable Respiratory status: spontaneous breathing, nonlabored ventilation and respiratory function stable Cardiovascular status: stable Postop Assessment: no headache, no backache and epidural receding Anesthetic complications: no   No notable events documented.  Last Vitals:  Vitals:   01/30/22 1435 01/30/22 1550  BP: (!) 111/58 101/63  Pulse: 80 79  Resp: 16   Temp: 36.9 C 36.8 C  SpO2: 99% 97%    Last Pain:  Vitals:   01/30/22 1550  TempSrc: Oral  PainSc:    Pain Goal:                Epidural/Spinal Function Cutaneous sensation: Able to Wiggle Toes (01/30/22 1549), Patient able to flex knees: Yes (01/30/22 1549), Patient able to lift hips off bed: Yes (01/30/22 1549), Back pain beyond tenderness at insertion site: No (01/30/22 1549), Progressively worsening motor and/or sensory loss: No (01/30/22 1549), Bowel and/or bladder incontinence post epidural: No (01/30/22 1549)  [name]

## 2022-01-31 LAB — CBC
HCT: 31.6 % — ABNORMAL LOW (ref 36.0–46.0)
Hemoglobin: 10.3 g/dL — ABNORMAL LOW (ref 12.0–15.0)
MCH: 28.5 pg (ref 26.0–34.0)
MCHC: 32.6 g/dL (ref 30.0–36.0)
MCV: 87.5 fL (ref 80.0–100.0)
Platelets: 259 10*3/uL (ref 150–400)
RBC: 3.61 MIL/uL — ABNORMAL LOW (ref 3.87–5.11)
RDW: 15.1 % (ref 11.5–15.5)
WBC: 13.1 10*3/uL — ABNORMAL HIGH (ref 4.0–10.5)
nRBC: 0 % (ref 0.0–0.2)

## 2022-01-31 NOTE — Lactation Note (Signed)
This note was copied from a baby's chart. Lactation Consultation Note  Patient Name: Linda Guerra [id number] Date: 01/31/2022 Reason for consult: Initial assessment;Term Age:36 hours   P2 mother whose infant is now 34 hours old.  This is a term baby at 39+3 weeks.  Mother's current feeding preference is breast.  Baby had a circumcision this morning.  Offered to assist with waking and latching; mother receptive.  Taught hand expression and finger fed a few colostrum drops.  Assisted to latch easily in the cross cradle hold, however, "Linda Guerra" was not interested in inititiating a suck.  Breast compressions and gentle stimulation demonstrated.  Per mother, swaddled and placed in the bassinet where he remained asleep.  Discussed the possibility of cluster feeding later today.  Mother will continue to do STS, breast massage and hand expression, feeding back any expressed milk she obtains to "Linda Guerra."  Encouraged to call for latch assistance as needed.  Father present at the end of my visit.   Maternal Data Has patient been taught Hand Expression?: Yes  Feeding Mother's Current Feeding Choice: Breast Milk  LATCH Score Latch: Too sleepy or reluctant, no latch achieved, no sucking elicited.  Audible Swallowing: None  Type of Nipple: Everted at rest and after stimulation  Comfort (Breast/Nipple): Soft / non-tender  Hold (Positioning): Assistance needed to correctly position infant at breast and maintain latch.  LATCH Score: 5   Lactation Tools Discussed/Used    Interventions Interventions: Breast feeding basics reviewed;Assisted with latch;Skin to skin;Breast massage;Hand express;Breast compression;Position options;Support pillows;Adjust position;Education;LC Services brochure  Discharge Pump: Personal WIC Program: No  Consult Status Consult Status: Follow-up Date: 02/01/22 Follow-up type: In-patient    Stan Cantave R Kristina Mcnorton 01/31/2022, 12:10 PM

## 2022-01-31 NOTE — Progress Notes (Signed)
Subjective: Postpartum Day 1: Cesarean Delivery  Patient reports tolerating PO and no problems voiding.  Ambulating well.  Objective: Vital signs in last 24 hours: Temp:  [97.7 F (36.5 C)-98.8 F (37.1 C)] 97.7 F (36.5 C) (05/28 0258) Pulse Rate:  [66-94] 92 (05/28 0258) Resp:  [16-27] 18 (05/28 0258) BP: (87-122)/(48-90) 104/59 (05/28 0258) SpO2:  [97 %-100 %] 97 % (05/28 0258)  Physical Exam:  General: alert and cooperative Lochia: appropriate Uterine Fundus: firm Incision: C/D/I   Recent Labs    01/29/22 0037 01/31/22 0349  HGB 11.8* 10.3*  HCT 35.9* 31.6*    Assessment/Plan: Status post Cesarean section and doing very well Continue current care and plan d/c home tomorrow.   Reviewed circumcision care with patient  Linda Guerra 01/31/2022, 10:12 AM

## 2022-02-01 ENCOUNTER — Encounter (HOSPITAL_COMMUNITY): Payer: Self-pay | Admitting: Obstetrics and Gynecology

## 2022-02-01 MED ORDER — IBUPROFEN 600 MG PO TABS: 600.0000 mg | ORAL_TABLET | Freq: Four times a day (QID) | ORAL | 1 refills | Status: DC | PRN

## 2022-02-01 MED ORDER — OXYCODONE-ACETAMINOPHEN 5-325 MG PO TABS: 1.0000 | ORAL_TABLET | ORAL | 0 refills | Status: AC | PRN

## 2022-02-01 NOTE — Lactation Note (Signed)
This note was copied from a baby's chart. Lactation Consultation Note  Patient Name: Linda Guerra [id number] Date: 02/01/2022 Reason for consult: Follow-up assessment Age:36 hours   LC Note:  Family ready to be transported out; mother feels confident in her breast feeding skills and is making good progress.  She admitted that "Ayden" likes to eat.  Suggested continued frequent feedings and to use her nipple balm for comfort.  Family has our OP phone number for any further questions/concerns.   Maternal Data    Feeding    LATCH Score                    Lactation Tools Discussed/Used    Interventions    Discharge    Consult Status Consult Status: Complete Date: 02/01/22 Follow-up type: Call as needed    Lanice Schwab Laysha Childers 02/01/2022, 3:49 PM

## 2022-02-01 NOTE — Discharge Summary (Signed)
Postpartum Discharge Summary  Date of Service updated      Patient Name: Linda Guerra DOB: Jan 04, 1986 MRN: [id number]  Date of admission: 01/29/2022 Delivery date:01/30/2022  Delivering provider: Paula Compton  Date of discharge: 02/01/2022  Admitting diagnosis: Advanced maternal age in multigravida, third trimester [O09.523] S/P primary low transverse C-section [Z98.891] Intrauterine pregnancy: [redacted]w[redacted]d    Secondary diagnosis:  Principal Problem:   Advanced maternal age in multigravida, third trimester Active Problems:   S/P primary low transverse C-section  Additional problems: none    Discharge diagnosis: Term Pregnancy Delivered                                              Post partum procedures: n/a Augmentation: AROM and Pitocin Complications: None  Hospital course: Induction of Labor With Cesarean Section   36 y.o. yo G2P2002 at 365w3das admitted to the hospital 01/29/2022 for induction of labor. Patient had a labor course significant for arrest of descent . The patient went for cesarean section due to Arrest of Descent. Delivery details are as follows: Membrane Rupture Time/Date: 7:51 AM ,01/29/2022   Delivery Method:C-Section, Low Transverse  Details of operation can be found in separate operative Note.  Patient had an uncomplicated postpartum course. She is ambulating, tolerating a regular diet, passing flatus, and urinating well.  Patient is discharged home in stable condition on 02/01/22.      Newborn Data: Birth date:01/30/2022  Birth time:9:25 AM  Gender:Female  Living status:Living  Apgars:9 ,9  Weight:4220 g                                Magnesium Sulfate received: No BMZ received: No Physical exam  Vitals:   01/31/22 0258 01/31/22 1500 01/31/22 2219 02/01/22 0617  BP: (!) 104/59 95/66 108/78 103/75  Pulse: 92 (!) 101 81 70  Resp: '18 18 18 18  '$ Temp: 97.7 F (36.5 C) 98.9 F (37.2 C) 97.9 F (36.6 C) 98.2 F (36.8 C)  TempSrc: Oral Oral Oral Oral   SpO2: 97%  100% 100%  Weight:      Height:       General: alert, cooperative, and no distress Lochia: appropriate Uterine Fundus: firm Incision: Dressing is clean, dry, and intact DVT Evaluation: No evidence of DVT seen on physical exam. Labs: Lab Results  Component Value Date   WBC 13.1 (H) 01/31/2022   HGB 10.3 (L) 01/31/2022   HCT 31.6 (L) 01/31/2022   MCV 87.5 01/31/2022   PLT 259 01/31/2022      Latest Ref Rng & Units 12/03/2019    7:56 AM  CMP  Glucose 70 - 99 mg/dL 114    BUN 6 - 20 mg/dL 13    Creatinine 0.44 - 1.00 mg/dL 0.47    Sodium 135 - 145 mmol/L 137    Potassium 3.5 - 5.1 mmol/L 3.9    Chloride 98 - 111 mmol/L 105    CO2 22 - 32 mmol/L 23    Calcium 8.9 - 10.3 mg/dL 9.6    Total Protein 6.5 - 8.1 g/dL 7.8    Total Bilirubin 0.3 - 1.2 mg/dL 0.3    Alkaline Phos 38 - 126 U/L 56    AST 15 - 41 U/L 22    ALT 0 - 44 U/L  28     Edinburgh Score:     View : No data to display.            After visit meds:  Allergies as of 02/01/2022   No Known Allergies      Medication List     TAKE these medications    calcium carbonate 500 MG chewable tablet Commonly known as: TUMS - dosed in mg elemental calcium Chew 1 tablet by mouth daily.   cetirizine 10 MG tablet Commonly known as: ZYRTEC Take 10 mg by mouth daily.   Fluticasone-Salmeterol 232-14 MCG/ACT Aepb Inhale 1 puff into the lungs in the morning and at bedtime.   ibuprofen 600 MG tablet Commonly known as: ADVIL Take 1 tablet (600 mg total) by mouth every 6 (six) hours as needed for moderate pain or cramping.   multivitamin-prenatal 27-0.8 MG Tabs tablet Take 1 tablet by mouth daily at 12 noon.   oxyCODONE-acetaminophen 5-325 MG tablet Commonly known as: Percocet Take 1 tablet by mouth every 4 (four) hours as needed for up to 7 days for severe pain.         Discharge home in stable condition Infant Feeding: Breast Infant Disposition:home with mother Discharge instruction: per  After Visit Summary and Postpartum booklet. Activity: Advance as tolerated. Pelvic rest for 6 weeks.  Diet: routine diet Anticipated Birth Control: Unsure Postpartum Appointment:6 weeks Additional Postpartum F/U: Incision check 2 weeks Future Appointments:No future appointments. Follow up Visit:  Follow-up Information     Ob/Gyn, [facility]. Schedule an appointment as soon as possible for a visit.   Why: 2 weeks for incision check and 6 weeks for postpartum visit Contact information: [place] [address] [phone]                     02/01/2022 Isaiah Serge, DO

## 2022-02-01 NOTE — Progress Notes (Signed)
Subjective: Postpartum Day 2: Cesarean Delivery Patient reports incisional pain, tolerating PO, + flatus, + BM, and no problems voiding.  Lochia mild.She denies HA, blurry vision, CP or SOB. Feels ready for discharge to home today  Objective: Vital signs in last 24 hours: Temp:  [97.9 F (36.6 C)-98.9 F (37.2 C)] 98.2 F (36.8 C) (05/29 0617) Pulse Rate:  [70-101] 70 (05/29 0617) Resp:  [18] 18 (05/29 0617) BP: (95-108)/(66-78) 103/75 (05/29 0617) SpO2:  [100 %] 100 % (05/29 0617)  Physical Exam:  General: alert, cooperative, and no distress Lochia: appropriate Uterine Fundus: firm Incision: no significant drainage DVT Evaluation: No evidence of DVT seen on physical exam.  Recent Labs    01/31/22 0349  HGB 10.3*  HCT 31.6*    Assessment/Plan: Status post Cesarean section. Doing well postoperatively.  Discharge home with standard precautions and return to clinic in 2 weeks for incision check and 6 weeks for postpartum visit  Joseantonio Dittmar W Azka Steger 02/01/2022, 10:26 AM

## 2022-02-01 NOTE — Discharge Instructions (Signed)
  Call office with any concerns [phone]

## 2022-02-05 MED FILL — Sodium Chloride IV Soln 0.9%: INTRAVENOUS | Qty: 1000 | Status: AC

## 2022-02-05 MED FILL — Heparin Sodium (Porcine) Inj 1000 Unit/ML: INTRAMUSCULAR | Qty: 30 | Status: AC

## 2022-02-08 ENCOUNTER — Telehealth (HOSPITAL_COMMUNITY): Payer: Self-pay | Admitting: *Deleted

## 2022-02-08 NOTE — Telephone Encounter (Signed)
Left phone voicemail message.  Odis Hollingshead, RN 02-08-2022 at 3:17pm

## 2022-12-22 ENCOUNTER — Ambulatory Visit (HOSPITAL_BASED_OUTPATIENT_CLINIC_OR_DEPARTMENT_OTHER): Payer: 59 | Admitting: Pulmonary Disease

## 2023-01-05 ENCOUNTER — Ambulatory Visit (HOSPITAL_BASED_OUTPATIENT_CLINIC_OR_DEPARTMENT_OTHER): Payer: 59 | Admitting: Pulmonary Disease

## 2023-01-05 ENCOUNTER — Encounter (HOSPITAL_BASED_OUTPATIENT_CLINIC_OR_DEPARTMENT_OTHER): Payer: Self-pay | Admitting: Pulmonary Disease

## 2023-01-05 VITALS — BP 118/60 | HR 80 | Temp 98.1°F | Ht 65.0 in | Wt 182.2 lb

## 2023-01-05 DIAGNOSIS — R053 Chronic cough: Secondary | ICD-10-CM | POA: Diagnosis not present

## 2023-01-05 DIAGNOSIS — J4530 Mild persistent asthma, uncomplicated: Secondary | ICD-10-CM | POA: Diagnosis not present

## 2023-01-05 NOTE — Patient Instructions (Signed)
Mild persistent asthma/cough variant asthma - persistent cough Chronic bronchitis  Allergic rhinitis --Will order ICS/LABA for 2-3 months

## 2023-01-05 NOTE — Progress Notes (Unsigned)
Subjective:   PATIENT ID: Linda Guerra GENDER: female DOB: Jan 25, 1986, MRN: [id number]   HPI  No chief complaint on file.  Reason for Visit: Follow-up chronic cough  Linda Guerra is a 37 year old female never smoker with history of childhood asthma, prior history of COVID-19 in 09/2019 who presents for follow-up.  Synopsis: When she had COVID-19 she was ill for over a month and was seen in the ED x2 with normal CXR and oxygen levels. Her body aches and lost of taste recovered however she has had persistent unproductive cough. She has a daily cough and throat clearing that occurs during the day. Denies nocturnal cough. She feels like she has throat/chest irritation. Associated with shortness of breath with coughing fits. Her cough has only been productive when she had a cold. Has tried mucinex and robitussin was previously helpful but now is no longer effective. When she was pregnant she did take a low dose of prednisone during pregnancy for seven but had no effect. She has tried [employer] with some help. Cough is aggravated by dust at work, pollen. She reports nasal congestion and postnasal drainage. Currently using sinex spray twice a day. Did not feel flonase worked well. Denies reflux but has stopped dairy that she felt increased the sensation of mucous production in her throat. She is able to work out daily on her bike. She has had limited her social life.  12/31/20 Since our last visit, she was unable to tolerate nasal spray. The cough syrup helped suppressed her cough, using it twice a day. Otherwise, cough remains unchanged. Activity unchanged and remains limited.  04/27/21 Since our last visit, PFTs were normal. Trial of Breo and singulair was prescribed. PRN cough syrup. She reports that after starting her inhaler she has partial benefit with her cough. Shortness of breath is usually associated with cough and this has improved. She feels that her flaring up is not as severe  outdoors. Being at work will trigger her symptoms. She did not think singulair after one month so self-discontinued. In the last month she has only taken her cough syrup twice in the last month. She found out she was pregnant three days ago.  01/05/23 Lost to follow-up. She continues to have cough. Has seen by ENT and told findings were related to reflux. She was compliant to diet and avoided triggers like caffeine however symptoms unchanged. Cough is nonproductive during the day. Able to sleep through the night. May be related to her workplace due to dustiness since co-workers have similar symptoms. Denies wheezing.   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     calcium carbonate (TUMS - DOSED IN MG ELEMENTAL CALCIUM) 500 MG chewable tablet Chew 1 tablet by mouth daily.     Fluticasone-Salmeterol 232-14 MCG/ACT AEPB Inhale 1 puff into the lungs in the morning and at bedtime. 1 each 6   ibuprofen (ADVIL) 600 MG tablet Take 1 tablet (600 mg total) by mouth every 6 (six) hours as needed for moderate pain or cramping. 40 tablet 1   Prenatal Vit-Fe Fumarate-FA (MULTIVITAMIN-PRENATAL)  27-0.8 MG TABS tablet Take 1 tablet by mouth daily at 12 noon.     No facility-administered medications prior to visit.    Review of Systems  Constitutional:  Negative for chills, diaphoresis, fever, malaise/fatigue and weight loss.  HENT:  Negative for congestion.   Respiratory:  Positive for cough and shortness of breath. Negative for hemoptysis, sputum production and wheezing.   Cardiovascular:  Negative for chest pain, palpitations and leg swelling.     Objective:    Vitals:   01/05/23 1542  BP: 118/60  Pulse: 80  Temp: 98.1 F (36.7 C)  TempSrc: Oral  SpO2: 99%  Weight: 182 lb 3.2 oz (82.6 kg)  Height: 5\' 5"  (1.651 m)   SpO2: 99 % O2 Device: None (Room air)  Physical Exam: General: Well-appearing, no acute distress HENT: [place], AT Eyes: EOMI, no scleral icterus Respiratory: Clear to auscultation bilaterally.  No crackles, wheezing or rales Cardiovascular: RRR, -M/R/G, no JVD Extremities:-Edema,-tenderness Neuro: AAO x4, CNII-XII grossly intact Psych: Normal mood, normal affect  Data Reviewed:  Imaging: CT A/P Lung fields 07/10/19 - Normal parenchyma in the lung bases. No reticulation, scarring or atelectasis noted. CXR 10/13/19 - No airspace disease, edema or effusion CXR 12/31/20 - No infiltate, effusion or edema  PFT: 01/27/21 FVC 3.21 (85%) FEV1 2.79 (89%) Ratio 85  TLC 94% DLCO 137% Interpretation: Normal spirometry. Increased DLCO. No significant bronchodilator response however does not preclude benefit on inhaler therapy. F-V suggests minimal obstructive defect presents.  Labs: 05/29/20 Hg 7.8 in setting of post-partum hemorrhage     Assessment & Plan:   Discussion: 38 year old female with asthma, allergic rhinitis and prior COVID-19 infection in 09/2019 who presents for follow-up. Better controlled on Breo however not resolved. Counseled on importance of compliance with bronchodilator therapy and minimizing asthma exacerbations in setting of pregnancy. Addressed questions regarding safety profile of medications are either B/C with the understanding that being off asthma medications risk maternal health. Given her symptoms are mild, she can trial being off medications. After discussion, Linda Guerra wished to continue treatment.  Mild persistent asthma/cough variant asthma - persistent cough Chronic bronchitis  Allergic rhinitis --Will order ICS/LABA for 2-3 months  **If you decide to use the lower dose of Breo, please our office  and we will change to Breo 100-25 mcg ONE puff daily**  ADDENDUM 8/24: Insurance will not cover Breo. Will change to fluticasone-salmeterol ONE puff TWICE a day  Health Maintenance Immunization History  Administered Date(s) Administered   PFIZER Comirnaty(Gray Top)Covid-19 Tri-Sucrose Vaccine 07/27/2020   PFIZER(Purple Top)SARS-COV-2 Vaccination 12/26/2019, 01/21/2020   CT Lung Screen - no tobacco history  No orders of the defined types were placed in this encounter.  No orders of the defined types were placed in this encounter.  No follow-ups on file.  I have spent a total time of 35-minutes on the day of the appointment reviewing prior documentation, coordinating care and discussing medical diagnosis and plan with the patient/family. Past medical history, allergies, medications were reviewed. Pertinent imaging, labs and tests included in this note have been reviewed and interpreted independently by me.   Psalms Olarte Mechele Collin, MD [facility] 01/05/2023 3:45 PM  Office Number [phone]

## 2023-01-06 ENCOUNTER — Other Ambulatory Visit (HOSPITAL_COMMUNITY): Payer: Self-pay

## 2023-01-06 MED ORDER — FLUTICASONE-SALMETEROL 115-21 MCG/ACT IN AERO: 2.0000 | INHALATION_SPRAY | Freq: Two times a day (BID) | RESPIRATORY_TRACT | 3 refills | Status: DC

## 2023-03-14 LAB — HM PAP SMEAR

## 2023-03-14 LAB — RESULTS CONSOLE HPV: CHL HPV: NEGATIVE

## 2023-03-25 ENCOUNTER — Ambulatory Visit (INDEPENDENT_AMBULATORY_CARE_PROVIDER_SITE_OTHER): Payer: 59 | Admitting: Pulmonary Disease

## 2023-03-25 ENCOUNTER — Encounter (HOSPITAL_BASED_OUTPATIENT_CLINIC_OR_DEPARTMENT_OTHER): Payer: Self-pay | Admitting: Pulmonary Disease

## 2023-03-25 DIAGNOSIS — J4530 Mild persistent asthma, uncomplicated: Secondary | ICD-10-CM

## 2023-03-29 ENCOUNTER — Encounter (HOSPITAL_BASED_OUTPATIENT_CLINIC_OR_DEPARTMENT_OTHER): Payer: Self-pay | Admitting: Pulmonary Disease

## 2023-03-29 DIAGNOSIS — R053 Chronic cough: Secondary | ICD-10-CM

## 2023-03-29 NOTE — Progress Notes (Signed)
Subjective:   PATIENT ID: Linda Guerra GENDER: female DOB: 07/15/1986, MRN: [id number]   HPI  Chief Complaint  Patient presents with   Follow-up    Pt has been having issues with asthma, has been using advair but not consistently. Act 18     Reason for Visit: Follow-up chronic cough  [name] is a 37 year old female never smoker with history of childhood asthma, prior history of COVID-19 in 09/2019 who presents for follow-up.  Synopsis: When she had COVID-19 she was ill for over a month and was seen in the ED x2 with normal CXR and oxygen levels. Her body aches and lost of taste recovered however she has had persistent unproductive cough. She has a daily cough and throat clearing that occurs during the day. Denies nocturnal cough. She feels like she has throat/chest irritation. Associated with shortness of breath with coughing fits. Her cough has only been productive when she had a cold. Has tried mucinex and robitussin was previously helpful but now is no longer effective. When she was pregnant she did take a low dose of prednisone during pregnancy for seven but had no effect. She has tried [employer] with some help. Cough is aggravated by dust at work, pollen. She reports nasal congestion and postnasal drainage. Currently using sinex spray twice a day. Did not feel flonase worked well. Denies reflux but has stopped dairy that she felt increased the sensation of mucous production in her throat. She is able to work out daily on her bike. She has had limited her social life.  12/31/20 Since our last visit, she was unable to tolerate nasal spray. The cough syrup helped suppressed her cough, using it twice a day. Otherwise, cough remains unchanged. Activity unchanged and remains limited.  04/27/21 Since our last visit, PFTs were normal. Trial of Breo and singulair was prescribed. PRN cough syrup. She reports that after starting her inhaler she has partial benefit with her cough.  Shortness of breath is usually associated with cough and this has improved. She feels that her flaring up is not as severe outdoors. Being at work will trigger her symptoms. She did not think singulair after one month so self-discontinued. In the last month she has only taken her cough syrup twice in the last month. She found out she was pregnant three days ago.  01/05/23 Lost to follow-up. She continues to have cough. Has seen by ENT and told findings were related to reflux. She was compliant to diet and avoided triggers like caffeine however symptoms unchanged. Cough is nonproductive during the day. Able to sleep through the night. May be related to her workplace due to dustiness since co-workers have similar symptoms. Denies wheezing.   03/29/23 In the last 2 months she has used Advair HFA. Noticed cough worsened off inhaler after 3 weeks. Restarted inhaler again this week. Reports nasal congestion, chest tightness. Has not needed prednisone taper or antibiotics since last visit. She is wanting a second opinion for ENT evaluation.   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     fluticasone-salmeterol (ADVAIR HFA) 115-21 MCG/ACT inhaler Inhale 2 puffs into the lungs 2 (two) times daily. 1 each 3   No facility-administered medications prior to visit.    Review of Systems  Constitutional:  Negative for chills, diaphoresis, fever, malaise/fatigue and weight loss.  HENT:  Positive for congestion.   Respiratory:  Positive for cough. Negative for hemoptysis, sputum  production, shortness of breath and wheezing.   Cardiovascular:  Negative for chest pain (chest tightness), palpitations and leg swelling.     Objective:   Vitals:   03/25/23 0854  BP: 122/68  Pulse: 78  Temp: 98.7 F (37.1 C)  TempSrc: Oral  SpO2: 99%  Weight: 182 lb 9.6 oz (82.8 kg)  Height: 5\' 5"  (1.651 m)   SpO2: 99 %  Physical Exam: General: Well-appearing, no acute distress HENT: [place], AT Eyes: EOMI, no scleral icterus Respiratory: Clear to auscultation bilaterally.  No crackles, wheezing or rales Cardiovascular: RRR, -M/R/G, no JVD Extremities:-Edema,-tenderness Neuro: AAO x4, CNII-XII grossly intact Psych: Normal mood, normal affect  Data Reviewed:  Imaging: CT A/P Lung fields 07/10/19 - Normal parenchyma in the lung bases. No reticulation, scarring or atelectasis noted. CXR 10/13/19 - No airspace disease, edema or effusion CXR 12/31/20 - No infiltate, effusion or edema  PFT: 01/27/21 FVC 3.21 (85%) FEV1 2.79 (89%) Ratio 85  TLC 94% DLCO 137% Interpretation: Normal spirometry. Increased DLCO. No significant bronchodilator response however does not preclude benefit on inhaler therapy. F-V suggests minimal obstructive defect presents.  Labs: 05/29/20 Hg 7.8 in setting of post-partum hemorrhage     Assessment & Plan:   Discussion: 37 year old female with asthma, allergic rhinitis and prior COVID 19 infection in 09/2019 who presents for follow-up. Partial improvement on Advair HFA. Previously on Breo but no longer covered. Symptoms recur/worsen off inhalers. ENT recommending management of reflux for chronic cough.  Mild persistent asthma/cough variant asthma - cough Chronic bronchitis  Allergic rhinitis --CONTINUE Advair HFA 115 mcg TWO puffs in the morning and evening. Rinse mouth out after use --CONTINUE Albuterol AS NEEDED for shortness of breath and wheezing --Patient will message [place] ENT physician name for 2nd opinion  Health Maintenance Immunization History   Administered Date(s) Administered   PFIZER Comirnaty(Gray Top)Covid-19 Tri-Sucrose Vaccine 07/27/2020   PFIZER(Purple Top)SARS-COV-2 Vaccination 12/26/2019, 01/21/2020   CT Lung Screen - no tobacco history  No orders of the defined types were placed in this encounter.  No orders of the defined types were placed in this encounter.  Return in about 6 months (around 09/25/2023).  I have spent a total time of 20-minutes on the day of the appointment including chart review, data review, collecting history, coordinating care and discussing medical diagnosis and plan with the patient/family. Past medical history, allergies, medications were reviewed. Pertinent imaging, labs and tests included in this note have been reviewed and interpreted independently by me.  Yudit Modesitt Mechele Collin, MD [facility] 03/29/2023 5:19 PM  Office Number [phone]

## 2023-04-15 ENCOUNTER — Encounter (HOSPITAL_BASED_OUTPATIENT_CLINIC_OR_DEPARTMENT_OTHER): Payer: Self-pay | Admitting: Pulmonary Disease

## 2023-04-17 MED ORDER — FLUTICASONE-SALMETEROL 230-21 MCG/ACT IN AERO: 2.0000 | INHALATION_SPRAY | Freq: Two times a day (BID) | RESPIRATORY_TRACT | 5 refills | Status: DC

## 2023-04-17 NOTE — Telephone Encounter (Signed)
Patient requesting increase in Advair HFA strength. Rx sent for Advair HFA 230-21 mcg.

## 2023-04-29 MED ORDER — FLUTICASONE-SALMETEROL 230-21 MCG/ACT IN AERO: 2.0000 | INHALATION_SPRAY | Freq: Two times a day (BID) | RESPIRATORY_TRACT | 5 refills | Status: DC

## 2023-04-29 NOTE — Addendum Note (Signed)
Addended by: Luciano Cutter on: 04/29/2023 04:24 PM   Modules accepted: Orders

## 2023-06-13 ENCOUNTER — Other Ambulatory Visit (HOSPITAL_BASED_OUTPATIENT_CLINIC_OR_DEPARTMENT_OTHER): Payer: Self-pay | Admitting: Pulmonary Disease

## 2023-06-14 ENCOUNTER — Encounter (HOSPITAL_BASED_OUTPATIENT_CLINIC_OR_DEPARTMENT_OTHER): Payer: Self-pay | Admitting: Pulmonary Disease

## 2023-06-15 MED ORDER — FLUTICASONE-SALMETEROL 230-21 MCG/ACT IN AERO: 2.0000 | INHALATION_SPRAY | Freq: Two times a day (BID) | RESPIRATORY_TRACT | 5 refills | Status: AC

## 2023-07-04 DIAGNOSIS — D259 Leiomyoma of uterus, unspecified: Secondary | ICD-10-CM | POA: Insufficient documentation

## 2023-07-05 ENCOUNTER — Ambulatory Visit: Payer: 59 | Admitting: Family Medicine

## 2023-07-19 ENCOUNTER — Encounter: Payer: Self-pay | Admitting: Family Medicine

## 2023-07-19 ENCOUNTER — Ambulatory Visit: Payer: 59 | Admitting: Family Medicine

## 2023-07-19 VITALS — BP 106/69 | HR 95 | Ht 65.0 in | Wt 186.0 lb

## 2023-07-19 DIAGNOSIS — H6122 Impacted cerumen, left ear: Secondary | ICD-10-CM

## 2023-07-19 DIAGNOSIS — Z0000 Encounter for general adult medical examination without abnormal findings: Secondary | ICD-10-CM | POA: Diagnosis not present

## 2023-07-19 LAB — COMPREHENSIVE METABOLIC PANEL
ALT: 29 U/L (ref 0–35)
AST: 24 U/L (ref 0–37)
Albumin: 4.8 g/dL (ref 3.5–5.2)
Alkaline Phosphatase: 79 U/L (ref 39–117)
BUN: 11 mg/dL (ref 6–23)
CO2: 30 meq/L (ref 19–32)
Calcium: 10.2 mg/dL (ref 8.4–10.5)
Chloride: 101 meq/L (ref 96–112)
Creatinine, Ser: 0.72 mg/dL (ref 0.40–1.20)
GFR: 106.9 mL/min (ref >60.00)
Glucose, Bld: 82 mg/dL (ref 70–99)
Potassium: 4.9 meq/L (ref 3.5–5.1)
Sodium: 139 meq/L (ref 135–145)
Total Bilirubin: 0.7 mg/dL (ref 0.2–1.2)
Total Protein: 8.3 g/dL (ref 6.0–8.3)

## 2023-07-19 LAB — LIPID PANEL
Cholesterol: 177 mg/dL (ref 0–200)
HDL: 65.1 mg/dL (ref >39.00)
LDL Cholesterol: 90 mg/dL (ref 0–99)
NonHDL: 111.5
Total CHOL/HDL Ratio: 3
Triglycerides: 107 mg/dL (ref 0.0–149.0)
VLDL: 21.4 mg/dL (ref 0.0–40.0)

## 2023-07-19 LAB — CBC WITH DIFFERENTIAL/PLATELET
Basophils Absolute: 0 10*3/uL (ref 0.0–0.1)
Basophils Relative: 0.7 % (ref 0.0–3.0)
Eosinophils Absolute: 0.1 10*3/uL (ref 0.0–0.7)
Eosinophils Relative: 0.9 % (ref 0.0–5.0)
HCT: 44.4 % (ref 36.0–46.0)
Hemoglobin: 14.8 g/dL (ref 12.0–15.0)
Lymphocytes Relative: 26.5 % (ref 12.0–46.0)
Lymphs Abs: 1.7 10*3/uL (ref 0.7–4.0)
MCHC: 33.3 g/dL (ref 30.0–36.0)
MCV: 91.9 fL (ref 78.0–100.0)
Monocytes Absolute: 0.5 10*3/uL (ref 0.1–1.0)
Monocytes Relative: 7.2 % (ref 3.0–12.0)
Neutro Abs: 4.1 10*3/uL (ref 1.4–7.7)
Neutrophils Relative %: 64.7 % (ref 43.0–77.0)
Platelets: 339 10*3/uL (ref 150.0–400.0)
RBC: 4.82 Mil/uL (ref 3.87–5.11)
RDW: 13.9 % (ref 11.5–15.5)
WBC: 6.3 10*3/uL (ref 4.0–10.5)

## 2023-07-19 LAB — TSH: TSH: 2.23 u[IU]/mL (ref 0.35–5.50)

## 2023-07-19 NOTE — Patient Instructions (Signed)
Thank you for choosing [facility] for your Primary Care needs. I am excited for the opportunity to partner with you to meet your health care goals. It was a pleasure meeting you today!  Information on diet, exercise, and health maintenance recommendations are listed below. This is information to help you be sure you are on track for optimal health and monitoring.   Please look over this and let us know if you have any questions or if you have completed any of the health maintenance outside of [facility] so that we can be sure your records are up to date.  ___________________________________________________________  MyChart:  For all urgent or time sensitive needs we ask that you please call the office to avoid delays. Our number is [phone]. MyChart is not constantly monitored and due to the large volume of messages a day, replies may take up to 72 business hours.  MyChart Policy: MyChart allows for you to see your visit notes, after visit summary, provider recommendations, lab and tests results, make an appointment, request refills, and contact your provider or the office for non-urgent questions or concerns. Providers are seeing patients during normal business hours and do not have built in time to review MyChart messages.  We ask that you allow a minimum of 3 business days for responses to [employer]. For this reason, please do not send urgent requests through MyChart. Please call the office at [phone]. New and ongoing conditions may require a visit. We have virtual and in-person visits available for your convenience.  Complex MyChart concerns may require a visit. Your provider may request you schedule a virtual or in-person visit to ensure we are providing the best care possible. MyChart messages sent after 11:00 AM on Friday may not be received by the provider until Monday morning.    Lab and Test Results: You will receive your lab and test  results on MyChart as soon as they are completed and results have been sent by the lab or testing facility. Due to this service, you will receive your results BEFORE your provider.  I review lab and test results each morning prior to seeing patients. Some results require collaboration with other providers to ensure you are receiving the most appropriate care. For this reason, we ask that you please allow a minimum of 3-5 business days from the time that ALL results have been received for your provider to receive and review lab and test results and contact you about these.  Most lab and test result comments from the provider will be sent through MyChart. Your provider may recommend changes to the plan of care, follow-up visits, repeat testing, ask questions, or request an office visit to discuss these results. You may reply directly to this message or call the office to provide information for the provider or set up an appointment. In some instances, you will be called with test results and recommendations. Please let us know if this is preferred and we will make note of this in your chart to provide this for you.    If you have not heard a response to your lab or test results in 5 business days from all results returning to MyChart, please call the office to let us know. We ask that you please avoid calling prior to this time unless there is an emergent concern. Due to high call volumes, this can delay the resulting process.  After Hours: For all non-emergency after hours needs, please  call the office at [phone] and select the option to reach the on-call  service. On-call services are shared between multiple [place] offices and therefore it will not be possible to speak directly with your provider. On-call providers may provide medical advice and recommendations, but are unable to provide refills for maintenance medications.  For all emergency or urgent medical needs after normal business hours, we  recommend that you seek care at the closest Urgent Care or Emergency Department to ensure appropriate treatment in a timely manner.  [facility] has a 24 hour emergency room located on the ground floor for your convenience.   Urgent Concerns During the Business Day Providers are seeing patients from 8AM to 5PM with a busy schedule and are most often not able to respond to non-urgent calls until the end of the day or the next business day. If you should have URGENT concerns during the day, please call and speak to the nurse or schedule a same day appointment so that we can address your concern without delay.   Thank you, again, for choosing me as your health care partner. I appreciate your trust and look forward to learning more about you!   Lollie Marrow Reola Calkins, DNP, FNP-C  ___________________________________________________________  Health Maintenance Recommendations Screening Testing Mammogram Every 1-2 years based on history and risk factors Starting at age 89 Pap Smear Ages 21-39 every 3 years Ages 67-65 every 5 years with HPV testing More frequent testing may be required based on results and history Colon Cancer Screening Every 1-10 years based on test performed, risk factors, and history Starting at age 108 Bone Density Screening Every 2-10 years based on history Starting at age 80 for women Recommendations for men differ based on medication usage, history, and risk factors AAA Screening One time ultrasound Men 52-68 years old who have ever smoked Lung Cancer Screening Low Dose Lung CT every 12 months Age 29-80 years with a 20 pack-year smoking history who still smoke or who have quit within the last 15 years  Screening Labs Routine  Labs: Complete Blood Count (CBC), Complete Metabolic Panel (CMP), Cholesterol (Lipid Panel) Every 6-12 months based on history and medications May be recommended more frequently based on current conditions or previous results Hemoglobin  A1c Lab Every 3-12 months based on history and previous results Starting at age 1 or earlier with diagnosis of diabetes, high cholesterol, BMI >26, and/or risk factors Frequent monitoring for patients with diabetes to ensure blood sugar control Thyroid Panel  Every 6 months based on history, symptoms, and risk factors May be repeated more often if on medication HIV One time testing for all patients 92 and older May be repeated more frequently for patients with increased risk factors or exposure Hepatitis C One time testing for all patients 19 and older May be repeated more frequently for patients with increased risk factors or exposure Gonorrhea, Chlamydia Every 12 months for all sexually active persons 13-24 years Additional monitoring may be recommended for those who are considered high risk or who have symptoms PSA Men 34-21 years old with risk factors Additional screening may be recommended from age 37-69 based on risk factors, symptoms, and history  Vaccine Recommendations Tetanus Booster All adults every 10 years Flu Vaccine All patients 6 months and older every year COVID Vaccine All patients 12 years and older Initial dosing with booster May recommend additional booster based on age and health history HPV Vaccine 2 doses all patients age 3-26 Dosing may be considered  for patients over 26 Shingles Vaccine (Shingrix) 2 doses all adults 50 years and older Pneumonia (Pneumovax 23) All adults 65 years and older May recommend earlier dosing based on health history Pneumonia (Prevnar 60) All adults 65 years and older Dosed 1 year after Pneumovax 23 Pneumonia (Prevnar 20) All adults 65 years and older (adults 19-64 with certain conditions or risk factors) 1 dose  For those who have not received Prevnar 13 vaccine previously   Additional Screening, Testing, and Vaccinations may be recommended on an individualized basis based on family history, health history, risk  factors, and/or exposure.  __________________________________________________________  Diet Recommendations for All Patients  I recommend that all patients maintain a diet low in saturated fats, carbohydrates, and cholesterol. While this can be challenging at first, it is not impossible and small changes can make big differences.  Things to try: Decreasing the amount of soda, sweet tea, and/or juice to one or less per day and replace with water While water is always the first choice, if you do not like water you may consider adding a water additive without sugar to improve the taste other sugar free drinks Replace potatoes with a brightly colored vegetable  Use healthy oils, such as canola oil or olive oil, instead of butter or hard margarine Limit your bread intake to two pieces or less a day Replace regular pasta with low carb pasta options Bake, broil, or grill foods instead of frying Monitor portion sizes  Eat smaller, more frequent meals throughout the day instead of large meals  An important thing to remember is, if you love foods that are not great for your health, you don't have to give them up completely. Instead, allow these foods to be a reward when you have done well. Allowing yourself to still have special treats every once in a while is a nice way to tell yourself thank you for working hard to keep yourself healthy.   Also remember that every day is a new day. If you have a bad day and "fall off the wagon", you can still climb right back up and keep moving along on your journey!  We have resources available to help you!  Some websites that may be helpful include: www.http://www.wall-[place].info/  Www.VeryWellFit.com _____________________________________________________________  Activity Recommendations for All Patients  I recommend that all adults get at least 30 minutes of moderate physical activity that elevates your heart rate at least 5 days out of the week.  Some examples  include: Walking or jogging at a pace that allows you to carry on a conversation Cycling (stationary bike or outdoors) Water aerobics Yoga Weight lifting Dancing If physical limitations prevent you from putting stress on your joints, exercise in a pool or seated in a chair are excellent options.  Do determine your MAXIMUM heart rate for activity: 220 - YOUR AGE = MAX Heart Rate   Remember! Do not push yourself too hard.  Start slowly and build up your pace, speed, weight, time in exercise, etc.  Allow your body to rest between exercise and get good sleep. You will need more water than normal when you are exerting yourself. Do not wait until you are thirsty to drink. Drink with a purpose of getting in at least 8, 8 ounce glasses of water a day plus more depending on how much you exercise and sweat.    If you begin to develop dizziness, chest pain, abdominal pain, jaw pain, shortness of breath, headache, vision changes, lightheadedness, or other concerning symptoms,  stop the activity and allow your body to rest. If your symptoms are severe, seek emergency evaluation immediately. If your symptoms are concerning, but not severe, please let us know so that we can recommend further evaluation.

## 2023-07-19 NOTE — Progress Notes (Signed)
Complete physical exam  Patient: Linda Guerra   DOB: 04-08-1986   37 y.o. Female  MRN: [id number]  Subjective:    Chief Complaint  Patient presents with   Establish Care    Linda Guerra is a 37 y.o. female who presents today for a complete physical exam and to establish care. She reports consuming a general diet. The patient does not participate in regular exercise at present. She generally feels well. She reports sleeping well. She does not have additional problems to discuss today.   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  Currently lives with: husband and 2 young children  Acute concerns or interim problems since last visit: no  Vision concerns: none Dental concerns: none  ETOH use: occasional Nicotine use: no Recreational drugs/illegal substances: no   Females:  She is currently  sexually active  Contraception choices are: none, husband vasectomy LMP: 07/16/23      Most recent fall risk assessment:    07/19/2023    8:56 AM  Fall Risk   Falls in the past year? 0  Number falls in past yr: 0  Injury with Fall? 0  Risk for fall due to : No Fall Risks  Follow up Falls evaluation completed     Most recent depression screenings:    07/19/2023    9:52 AM 11/21/2019   10:10 AM  PHQ 2/9 Scores  PHQ - 2 Score 2 0  PHQ- 9 Score 5        07/19/2023    9:53 AM  GAD 7 : Generalized Anxiety Score  Nervous, Anxious, on Edge 1  Control/stop worrying 1  Worry too much - different things 1  Trouble relaxing 1  Restless 0  Easily annoyed or irritable 1  Afraid - awful might happen 0  Total GAD 7 Score 5  Anxiety Difficulty Not difficult at all          Patient Care Team: Clayborne Dana, NP as PCP - General (Family Medicine)   Outpatient Medications Prior to Visit  Medication Sig   Biotin 800 MCG TABS Take by mouth.   fluticasone-salmeterol (ADVAIR HFA) 230-21 MCG/ACT inhaler Inhale 2 puffs into the lungs 2 (two) times daily.   [DISCONTINUED]  Ferrous Sulfate (IRON PO) Take by mouth.   [DISCONTINUED] valACYclovir (VALTREX) 1000 MG tablet Take 2 tabs twice daily x 1 day at first symptom onset   No facility-administered medications prior to visit.    ROS All review of systems negative except what is listed in the HPI        Objective:     BP 106/69   Pulse 95   Ht 5\' 5"  (1.651 m)   Wt 186 lb (84.4 kg)   SpO2 98%   BMI 30.95 kg/m    Physical Exam Vitals reviewed.  Constitutional:      General: She is not in acute distress.    Appearance: Normal appearance. She is not ill-appearing.  HENT:     Head: Normocephalic and atraumatic.     Right Ear: Tympanic membrane normal.     Left Ear: There is impacted cerumen.     Nose: Nose normal.     Mouth/Throat:     Mouth: Mucous membranes are moist.     Pharynx: Oropharynx is clear.  Eyes:     Extraocular Movements: Extraocular movements intact.     Conjunctiva/sclera: Conjunctivae normal.     Pupils: Pupils are equal, round, and reactive to light.  Neck:  Vascular: No carotid bruit.  Cardiovascular:     Rate and Rhythm: Normal rate and regular rhythm.     Pulses: Normal pulses.     Heart sounds: Normal heart sounds.  Pulmonary:     Effort: Pulmonary effort is normal.     Breath sounds: Normal breath sounds.  Abdominal:     General: Abdomen is flat. Bowel sounds are normal. There is no distension.     Palpations: Abdomen is soft. There is no mass.     Tenderness: There is no abdominal tenderness. There is no right CVA tenderness, left CVA tenderness, guarding or rebound.  Genitourinary:    Comments: Deferred exam Musculoskeletal:        General: Normal range of motion.     Cervical back: Normal range of motion and neck supple. No tenderness.     Right lower leg: No edema.     Left lower leg: No edema.  Lymphadenopathy:     Cervical: No cervical adenopathy.  Skin:    General: Skin is warm and dry.     Capillary Refill: Capillary refill takes less than 2  seconds.  Neurological:     General: No focal deficit present.     Mental Status: She is alert and oriented to person, place, and time. Mental status is at baseline.  Psychiatric:        Mood and Affect: Mood normal.        Behavior: Behavior normal.        Thought Content: Thought content normal.        Judgment: Judgment normal.      No results found for any visits on 07/19/23.     Assessment & Plan:    Routine Health Maintenance and Physical Exam Discussed health promotion and safety including diet and exercise recommendations, dental health, and injury prevention. Tobacco cessation if applicable. Seat belts, sunscreen, smoke detectors, etc.    Immunization History  Administered Date(s) Administered   Influenza Inj Mdck Quad Pf 07/23/2021   PFIZER Comirnaty(Gray Top)Covid-19 Tri-Sucrose Vaccine 07/27/2020   PFIZER(Purple Top)SARS-COV-2 Vaccination 12/26/2019, 01/21/2020   Tdap 04/24/2020, 12/08/2021    Health Maintenance  Topic Date Due   Cervical Cancer Screening (HPV/Pap Cotest)  Never done   INFLUENZA VACCINE  12/05/2023 (Originally 04/07/2023)   COVID-19 Vaccine (4 - 2023-24 season) 07/03/2024 (Originally 05/08/2023)   DTaP/Tdap/Td (3 - Td or Tdap) 12/09/2031   Hepatitis C Screening  Completed   HIV Screening  Completed   HPV VACCINES  Aged Out        Problem List Items Addressed This Visit   None Visit Diagnoses     Annual physical exam    -  Primary   Relevant Orders   CBC with Differential/Platelet   Comprehensive metabolic panel   Lipid panel   TSH   Encounter for medical examination to establish care        Impacted cerumen of left ear     Discussed supportive measures - Debrox, etc.  Follow-up for irrigation if bothersome      Return in about 1 year (around 07/18/2024) for physical.     Clayborne Dana, NP

## 2024-06-02 ENCOUNTER — Other Ambulatory Visit (HOSPITAL_BASED_OUTPATIENT_CLINIC_OR_DEPARTMENT_OTHER): Payer: Self-pay | Admitting: Pulmonary Disease
